# Patient Record
Sex: Female | Born: 1985 | Race: Black or African American | Hispanic: No | Marital: Single | Smoking: Former smoker
Health system: Southern US, Community
[De-identification: ages and names within clinical notes are randomized; demographics above are authoritative.]

## PROBLEM LIST (undated history)

## (undated) DIAGNOSIS — D649 Anemia, unspecified: Secondary | ICD-10-CM

## (undated) DIAGNOSIS — I10 Essential (primary) hypertension: Secondary | ICD-10-CM

## (undated) DIAGNOSIS — R519 Headache, unspecified: Secondary | ICD-10-CM

## (undated) DIAGNOSIS — O139 Gestational [pregnancy-induced] hypertension without significant proteinuria, unspecified trimester: Secondary | ICD-10-CM

## (undated) HISTORY — DX: Gestational (pregnancy-induced) hypertension without significant proteinuria, unspecified trimester: O13.9

## (undated) HISTORY — PX: NO PAST SURGERIES: SHX2092

## (undated) HISTORY — DX: Headache, unspecified: R51.9

## (undated) HISTORY — DX: Anemia, unspecified: D64.9

---

## 2010-06-08 ENCOUNTER — Emergency Department: Payer: Self-pay | Admitting: Emergency Medicine

## 2010-12-16 ENCOUNTER — Emergency Department: Payer: Self-pay | Admitting: Emergency Medicine

## 2012-05-14 ENCOUNTER — Emergency Department: Payer: Self-pay | Admitting: Emergency Medicine

## 2012-05-14 LAB — URINALYSIS, COMPLETE
Bilirubin,UR: NEGATIVE
Glucose,UR: NEGATIVE mg/dL (ref 0–75)
Ketone: NEGATIVE
Specific Gravity: 1.01 (ref 1.003–1.030)
Squamous Epithelial: 11
WBC UR: 8 /HPF (ref 0–5)

## 2012-05-14 LAB — CBC
HCT: 33.8 % — ABNORMAL LOW (ref 35.0–47.0)
HGB: 11.6 g/dL — ABNORMAL LOW (ref 12.0–16.0)
MCHC: 34.2 g/dL (ref 32.0–36.0)
RBC: 3.61 10*6/uL — ABNORMAL LOW (ref 3.80–5.20)
WBC: 21.5 10*3/uL — ABNORMAL HIGH (ref 3.6–11.0)

## 2012-05-14 LAB — COMPREHENSIVE METABOLIC PANEL
Albumin: 3.9 g/dL (ref 3.4–5.0)
Anion Gap: 10 (ref 7–16)
Bilirubin,Total: 0.8 mg/dL (ref 0.2–1.0)
Chloride: 104 mmol/L (ref 98–107)
Co2: 24 mmol/L (ref 21–32)
EGFR (African American): >60
EGFR (Non-African Amer.): >60
Potassium: 3.3 mmol/L — ABNORMAL LOW (ref 3.5–5.1)
SGOT(AST): 19 U/L (ref 15–37)
SGPT (ALT): 9 U/L — ABNORMAL LOW (ref 12–78)

## 2012-05-14 LAB — WET PREP, GENITAL

## 2012-05-14 LAB — LIPASE, BLOOD: Lipase: 78 U/L (ref 73–393)

## 2012-05-14 LAB — PREGNANCY, URINE: Pregnancy Test, Urine: NEGATIVE m[IU]/mL

## 2014-08-08 NOTE — L&D Delivery Note (Signed)
VAGINAL DELIVERY NOTE:  Date of Delivery: 04/30/2015 Primary OB: [facility] Gestational Age/EDD: Unknown Not found. Antepartum complications: none Attending Physician: Milon Score, CNM  Delivery Type: spontaneous vaginal delivery  Anesthesia: none Laceration: n/a Episiotomy: none Placenta: spontaneous Intrapartum complications: pre-cip birth Estimated Blood Loss: 100 ml  GBS: Positive Procedure Details: Called to check pt and she was complete and AROM with mod mec noted (brown) and while getting gloves on, pt preciped the vtx, and then the whole body at one push. Baby crying. I picked up the baby and dried and stimulated the crying baby. No sx used.  Baby: LivebornGirl, Wt7# 1oz 3200 gms, Apgars 9-9. No Lacs, SDOP intact with mec stained placenta. FF and lochia mod. VSS. Hemostasis achieved. Perineum intact.  Milon Score, CNM

## 2014-09-18 ENCOUNTER — Emergency Department: Payer: Self-pay | Admitting: Emergency Medicine

## 2014-09-24 ENCOUNTER — Emergency Department: Payer: Self-pay | Admitting: Student

## 2014-10-01 LAB — OB RESULTS CONSOLE HIV ANTIBODY (ROUTINE TESTING): HIV: NONREACTIVE

## 2014-10-02 ENCOUNTER — Ambulatory Visit: Payer: Self-pay | Admitting: Advanced Practice Midwife

## 2014-10-02 LAB — OB RESULTS CONSOLE ABO/RH: RH Type: POSITIVE

## 2014-10-02 LAB — OB RESULTS CONSOLE RPR: RPR: NONREACTIVE

## 2014-10-02 LAB — OB RESULTS CONSOLE ANTIBODY SCREEN: ANTIBODY SCREEN: NEGATIVE

## 2014-10-02 LAB — OB RESULTS CONSOLE HEPATITIS B SURFACE ANTIGEN: Hepatitis B Surface Ag: NEGATIVE

## 2014-10-10 ENCOUNTER — Emergency Department: Payer: Self-pay | Admitting: Emergency Medicine

## 2014-11-03 ENCOUNTER — Encounter
Admit: 2014-11-03 | Disposition: A | Discharge status: Home / Self Care | Payer: Self-pay | Admitting: Maternal & Fetal Medicine

## 2014-11-21 ENCOUNTER — Other Ambulatory Visit: Payer: Self-pay | Admitting: Maternal & Fetal Medicine

## 2014-11-21 DIAGNOSIS — IMO0002 Reserved for concepts with insufficient information to code with codable children: Secondary | ICD-10-CM

## 2014-11-21 DIAGNOSIS — Z0489 Encounter for examination and observation for other specified reasons: Secondary | ICD-10-CM

## 2014-12-08 ENCOUNTER — Ambulatory Visit
Admission: RE | Admit: 2014-12-08 | Discharge: 2014-12-08 | Disposition: A | Discharge status: Home / Self Care | Payer: Medicaid Other | Source: Ambulatory Visit | Attending: Maternal & Fetal Medicine | Admitting: Maternal & Fetal Medicine

## 2014-12-08 DIAGNOSIS — IMO0002 Reserved for concepts with insufficient information to code with codable children: Secondary | ICD-10-CM

## 2014-12-08 DIAGNOSIS — Z36 Encounter for antenatal screening of mother: Secondary | ICD-10-CM | POA: Diagnosis not present

## 2014-12-08 DIAGNOSIS — Z0489 Encounter for examination and observation for other specified reasons: Secondary | ICD-10-CM

## 2014-12-08 LAB — US OB DETAIL + 14 WK

## 2014-12-27 ENCOUNTER — Encounter: Payer: Self-pay | Admitting: Emergency Medicine

## 2014-12-27 DIAGNOSIS — K088 Other specified disorders of teeth and supporting structures: Secondary | ICD-10-CM | POA: Diagnosis present

## 2014-12-27 DIAGNOSIS — Z79899 Other long term (current) drug therapy: Secondary | ICD-10-CM | POA: Insufficient documentation

## 2014-12-27 DIAGNOSIS — M273 Alveolitis of jaws: Secondary | ICD-10-CM | POA: Diagnosis not present

## 2014-12-27 DIAGNOSIS — R51 Headache: Secondary | ICD-10-CM | POA: Diagnosis not present

## 2014-12-27 NOTE — ED Notes (Signed)
Pt says she had a tooth pulled on the bottom right side of her mouth on Tuesday; pain since; was prescribed Tylenol #3 (she is [redacted] weeks pregnant) and that is not helping her; was not prescribed antibiotic

## 2014-12-28 ENCOUNTER — Emergency Department
Admission: EM | Admit: 2014-12-28 | Discharge: 2014-12-28 | Disposition: A | Discharge status: Home / Self Care | Payer: Medicaid Other | Attending: Emergency Medicine | Admitting: Emergency Medicine

## 2014-12-28 DIAGNOSIS — K0889 Other specified disorders of teeth and supporting structures: Secondary | ICD-10-CM

## 2014-12-28 DIAGNOSIS — M273 Alveolitis of jaws: Secondary | ICD-10-CM

## 2014-12-28 MED ORDER — ACETAMINOPHEN 325 MG PO TABS
ORAL_TABLET | ORAL | Status: AC
  Administered 2014-12-28: 650 mg via ORAL
  Filled 2014-12-28: qty 2

## 2014-12-28 MED ORDER — OXYCODONE-ACETAMINOPHEN 5-325 MG PO TABS: 1.0000 | ORAL_TABLET | Freq: Four times a day (QID) | ORAL | Status: DC | PRN

## 2014-12-28 MED ORDER — CEPHALEXIN 500 MG PO CAPS
500.0000 mg | ORAL_CAPSULE | Freq: Once | ORAL | Status: AC
  Administered 2014-12-28: 500 mg via ORAL

## 2014-12-28 MED ORDER — OXYCODONE HCL 5 MG PO TABS: 5.0000 mg | ORAL_TABLET | Freq: Once | ORAL | Status: DC

## 2014-12-28 MED ORDER — CEPHALEXIN 500 MG PO CAPS
ORAL_CAPSULE | ORAL | Status: AC
  Administered 2014-12-28: 500 mg via ORAL
  Filled 2014-12-28: qty 1

## 2014-12-28 MED ORDER — CEPHALEXIN 500 MG PO CAPS: 500.0000 mg | ORAL_CAPSULE | Freq: Four times a day (QID) | ORAL | Status: AC

## 2014-12-28 MED ORDER — ACETAMINOPHEN 325 MG PO TABS
650.0000 mg | ORAL_TABLET | Freq: Once | ORAL | Status: AC
  Administered 2014-12-28: 650 mg via ORAL

## 2014-12-28 NOTE — Discharge Instructions (Signed)
Dental Dry Socket After a tooth is pulled (extracted), blood fills up the hole (socket) where the tooth once was. This blood hardens (clots) and protects the bone and nerves underneath. Normally, gums completely grow over the top of the bones and nerves and close an open socket in about a week. After several months, the clot is replaced by bone that grows into the socket. However, when blood does not fill up the extraction socket, or the blood clot is lost for some reason, a dry socket may form. This condition leaves the bone and nerves exposed to air, food, liquid, or anything else that enters the mouth. The gums cannot grow over the extraction socket because there is nothing to grow over, and the dry socket remains open.  CAUSES   Blood not filling up the extraction socket properly.  Anything that can dislodge a forming blood clot. Forceful spitting or sucking through a straw can pull a blood clot completely out of the socket and cause a dry socket.  Having a difficult extraction. The forceful pushing against the wall of the socket when the tooth is extracted causes the walls of the tooth socket to become crushed. This prevents bleeding into the socket because the blood vessels have been crushed closed.  Alcoholic drinks may dry out the blood clot and cause a dry socket.  Smoking can disturb blood clot formation and cause a dry socket. Factors that put you at an increased risk for a dry socket include:   Having lower teeth extracted.  Being female.  Poor oral hygiene.  Taking birth control pills.  Having your wisdom teeth extracted. SYMPTOMS  Severe, constant, dull throbbing pain. The pain generally begins 2 to 3 days after the tooth extraction. The pain may last about a week after it begins.  Bad smelling breath and bad taste in your mouth.  Ear pain. HOME CARE INSTRUCTIONS  Follow your dentist's instructions.  Only take over-the-counter or prescription medicines for pain,  discomfort, or fever as directed by your caregiver. If pain medication does not relieve the pain, you may need to see your dentistwho can clean the socket and place a medicated dressing on the extraction to promote healing.  Take your antibiotics as told, if prescribed. Finish them even if you start to feel better.  Wait at least a day before rinsing with warm salt water to avoid possibly dissolving the new blood clot. When salt water rinsing, spit gently to avoid pressure on the clot.  Avoid carbonated beverages.  Avoid alcohol.  Avoid smoking for a few days after surgery. Patients who have recently had oral surgery should avoid anything that may irritate the extraction socket or anything that may cause the blood clot inside the extraction socket from being dislodged. Carefully follow your instructions for after surgery care.  SEEK IMMEDIATE DENTAL CARE IF:  Your medicine does not relieve pain.  You have uncontrolled bleeding, marked swelling, or severe pain.  You develop a fever above 102 F (38.9 C), not controlled by medication.  You have difficulty swallowing or cannot open your mouth.  You have other severe symptoms. Document Released: 01/29/2003 Document Revised: 10/17/2011 Document Reviewed: 10/27/2010 [facility] [employer] Patient Information 2015 [place], [place]. This information is not intended to replace advice given to you by your health care provider. Make sure you discuss any questions you have with your health care provider.  Dental Pain A tooth ache may be caused by cavities (tooth decay). Cavities expose the nerve of the tooth to air  and hot or cold temperatures. It may come from an infection or abscess (also called a boil or furuncle) around your tooth. It is also often caused by dental caries (tooth decay). This causes the pain you are having. DIAGNOSIS  Your caregiver can diagnose this problem by exam. TREATMENT   If caused by an infection, it may be treated with medications  which kill germs (antibiotics) and pain medications as prescribed by your caregiver. Take medications as directed.  Only take over-the-counter or prescription medicines for pain, discomfort, or fever as directed by your caregiver.  Whether the tooth ache today is caused by infection or dental disease, you should see your dentist as soon as possible for further care. SEEK MEDICAL CARE IF: The exam and treatment you received today has been provided on an emergency basis only. This is not a substitute for complete medical or dental care. If your problem worsens or new problems (symptoms) appear, and you are unable to meet with your dentist, call or return to this location. SEEK IMMEDIATE MEDICAL CARE IF:   You have a fever.  You develop redness and swelling of your face, jaw, or neck.  You are unable to open your mouth.  You have severe pain uncontrolled by pain medicine. MAKE SURE YOU:   Understand these instructions.  Will watch your condition.  Will get help right away if you are not doing well or get worse. Document Released: 07/25/2005 Document Revised: 10/17/2011 Document Reviewed: 03/12/2008 [facility] Patient Information 2015 [place], [place]. This information is not intended to replace advice given to you by your health care provider. Make sure you discuss any questions you have with your health care provider.

## 2014-12-28 NOTE — ED Provider Notes (Signed)
[facility] Emergency Department Provider Note  ____________________________________________  Time seen: Approximately 5:00 AM  I have reviewed the triage vital signs and the nursing notes.   HISTORY  Chief Complaint Dental Pain    HPI Carly Stewart is a 29 y.o. female who comes in with mouth pain after having a tooth pulled proximally 5 days ago. The patient reports that she was given Tylenol 3 but it has not been helping with her pain. The patient reports that she did not receive antibiotics and has an appointment in 2 days to reevaluate her gums and teeth. The patient reports that the [facility] [place]. The patient denies any fevers but reports that she does have some pain in her right ear. She reports that the pain in her tooth is a 10 out of 10 in intensity and throbbing. The patient has not taken any ibuprofen as she is [redacted] weeks pregnant and cannot take those types of medications. The patient reports that she's had some headache as well as some vomiting due to this pain. Patient reports that she comes into the hospital as she has been unable to sleep due to the pain as well.   History reviewed. No pertinent past medical history.  There are no active problems to display for this patient.   History reviewed. No pertinent past surgical history.  Current Outpatient Rx  Name  Route  Sig  Dispense  Refill  . Prenatal Vit-Fe Fumarate-FA (MULTIVITAMIN-PRENATAL) 27-0.8 MG TABS tablet   Oral   Take 1 tablet by mouth daily at 12 noon.         . ondansetron (ZOFRAN) 4 MG tablet   Oral   Take 4 mg by mouth every 8 (eight) hours as needed for nausea or vomiting.           Allergies Review of patient's allergies indicates no known allergies.  History reviewed. No pertinent family history.  Social History History  Substance Use Topics  . Smoking status: Never Smoker   . Smokeless tobacco: Never Used  .  Alcohol Use: No    Review of Systems Constitutional: No fever/chills Eyes: No visual changes. ENT: No sore throat. Cardiovascular: Denies chest pain. Respiratory: Denies shortness of breath. Gastrointestinal: vomiting.   Genitourinary: Negative for dysuria. Musculoskeletal: Negative for back pain. Skin: Negative for rash. Neurological:  headaches,   10-point ROS otherwise negative.  ____________________________________________   PHYSICAL EXAM:  VITAL SIGNS: ED Triage Vitals  Enc Vitals Group     BP 12/27/14 2336 125/62 mmHg     Pulse Rate 12/27/14 2336 70     Resp 12/27/14 2336 18     Temp 12/27/14 2336 97.5 F (36.4 C)     Temp Source 12/27/14 2336 Oral     SpO2 12/27/14 2336 100 %     Weight 12/27/14 2336 118 lb (53.524 kg)     Height 12/27/14 2336 5\' 2"  (1.575 m)     Head Cir --      Peak Flow --      Pain Score 12/27/14 2337 10     Pain Loc --      Pain Edu? --      Excl. in GC? --     Constitutional: Alert and oriented. Well appearing and in moderate distress. Eyes: Conjunctivae are normal. PERRL. EOMI. Head: Atraumatic. Nose: No congestion/rhinnorhea. Mouth/Throat: Poor dentition right gums with a healing area no visible clot noted, oropharynx nonerythematous  Cardiovascular: Normal rate, regular rhythm. Grossly normal heart sounds.  Good peripheral circulation. Respiratory: Normal respiratory effort.  No retractions. Lungs CTAB. Gastrointestinal: Soft and nontender. Gravid uterus Genitourinary: Deferred Musculoskeletal: No lower extremity tenderness nor edema.  Neurologic:  Normal speech and language. No gross focal neurologic deficits are appreciated.  Skin:  Skin is warm, dry and intact. No rash noted. Psychiatric: Mood and affect are normal. Speech and behavior are normal.  ____________________________________________   LABS (all labs ordered are listed, but only abnormal results are displayed)  Labs Reviewed - No data to  display ____________________________________________  EKG  None ____________________________________________  RADIOLOGY  None ____________________________________________   PROCEDURES  Procedure(s) performed: None  Critical Care performed: No  ____________________________________________   INITIAL IMPRESSION / ASSESSMENT AND PLAN / ED COURSE  Pertinent labs & imaging results that were available during my care of the patient were reviewed by me and considered in my medical decision making (see chart for details).  This is a 29 year old female who is [redacted] weeks pregnant comes in with right pain in her jaw after having a tooth pulled last week. Since the patient is pregnant I am unable to give her NSAIDs which may help with the inflammation. I will give the patient a dose of Keflex 500 mg oral 1. I will also hold off on giving the patient any stronger pain medication as again she is pregnant and it may be harmful to the fetus.  After evaluating the pharmaceutical database oxycodone is found to be a class B. I will give the patient a dose of oxycodone and discharge her to follow-up with her dentist. ____________________________________________   FINAL CLINICAL IMPRESSION(S) / ED DIAGNOSES  Final diagnoses:  Dry socket  Dental pain       Rebecka Apley, MD 12/28/14 [phone]

## 2015-02-11 LAB — HM HIV SCREENING LAB: HM HIV Screening: NEGATIVE

## 2015-04-24 LAB — OB RESULTS CONSOLE GC/CHLAMYDIA
Chlamydia: NEGATIVE
Gonorrhea: NEGATIVE

## 2015-04-24 LAB — OB RESULTS CONSOLE GBS: STREP GROUP B AG: POSITIVE

## 2015-04-30 ENCOUNTER — Inpatient Hospital Stay
Admission: EM | Admit: 2015-04-30 | Discharge: 2015-05-03 | DRG: 774 | Disposition: A | Discharge status: Home / Self Care | Payer: Medicaid Other | Attending: Obstetrics and Gynecology | Admitting: Obstetrics and Gynecology

## 2015-04-30 DIAGNOSIS — F17210 Nicotine dependence, cigarettes, uncomplicated: Secondary | ICD-10-CM | POA: Diagnosis present

## 2015-04-30 DIAGNOSIS — O9089 Other complications of the puerperium, not elsewhere classified: Secondary | ICD-10-CM | POA: Diagnosis not present

## 2015-04-30 DIAGNOSIS — E876 Hypokalemia: Secondary | ICD-10-CM | POA: Diagnosis not present

## 2015-04-30 DIAGNOSIS — Z3A38 38 weeks gestation of pregnancy: Secondary | ICD-10-CM | POA: Diagnosis present

## 2015-04-30 DIAGNOSIS — O1092 Unspecified pre-existing hypertension complicating childbirth: Secondary | ICD-10-CM | POA: Diagnosis present

## 2015-04-30 DIAGNOSIS — O99334 Smoking (tobacco) complicating childbirth: Secondary | ICD-10-CM | POA: Diagnosis present

## 2015-04-30 DIAGNOSIS — O99824 Streptococcus B carrier state complicating childbirth: Principal | ICD-10-CM | POA: Diagnosis present

## 2015-04-30 DIAGNOSIS — O1002 Pre-existing essential hypertension complicating childbirth: Secondary | ICD-10-CM | POA: Diagnosis present

## 2015-04-30 DIAGNOSIS — O99820 Streptococcus B carrier state complicating pregnancy: Secondary | ICD-10-CM | POA: Diagnosis present

## 2015-04-30 LAB — CBC
HEMATOCRIT: 34.2 % — AB (ref 35.0–47.0)
Hemoglobin: 11.4 g/dL — ABNORMAL LOW (ref 12.0–16.0)
MCH: 31.3 pg (ref 26.0–34.0)
MCHC: 33.3 g/dL (ref 32.0–36.0)
MCV: 94 fL (ref 80.0–100.0)
Platelets: 200 10*3/uL (ref 150–440)
RBC: 3.64 MIL/uL — ABNORMAL LOW (ref 3.80–5.20)
RDW: 14.4 % (ref 11.5–14.5)
WBC: 7.3 10*3/uL (ref 3.6–11.0)

## 2015-04-30 LAB — COMPREHENSIVE METABOLIC PANEL
ALT: 9 U/L — AB (ref 14–54)
AST: 27 U/L (ref 15–41)
Albumin: 3.2 g/dL — ABNORMAL LOW (ref 3.5–5.0)
Alkaline Phosphatase: 212 U/L — ABNORMAL HIGH (ref 38–126)
Anion gap: 6 (ref 5–15)
BUN: <5 mg/dL — ABNORMAL LOW (ref 6–20)
CHLORIDE: 105 mmol/L (ref 101–111)
CO2: 22 mmol/L (ref 22–32)
CREATININE: 0.42 mg/dL — AB (ref 0.44–1.00)
Calcium: 8.7 mg/dL — ABNORMAL LOW (ref 8.9–10.3)
Glucose, Bld: 89 mg/dL (ref 65–99)
Potassium: 3 mmol/L — ABNORMAL LOW (ref 3.5–5.1)
SODIUM: 133 mmol/L — AB (ref 135–145)
Total Bilirubin: 0.5 mg/dL (ref 0.3–1.2)
Total Protein: 6.9 g/dL (ref 6.5–8.1)

## 2015-04-30 LAB — URINE DRUG SCREEN, QUALITATIVE (ARMC ONLY)
AMPHETAMINES, UR SCREEN: NOT DETECTED
Barbiturates, Ur Screen: NOT DETECTED
Benzodiazepine, Ur Scrn: NOT DETECTED
COCAINE METABOLITE, UR ~~LOC~~: NOT DETECTED
Cannabinoid 50 Ng, Ur ~~LOC~~: POSITIVE — AB
MDMA (ECSTASY) UR SCREEN: NOT DETECTED
Methadone Scn, Ur: NOT DETECTED
OPIATE, UR SCREEN: NOT DETECTED
PHENCYCLIDINE (PCP) UR S: NOT DETECTED
Tricyclic, Ur Screen: NOT DETECTED

## 2015-04-30 LAB — TYPE AND SCREEN
ABO/RH(D): AB POS
Antibody Screen: NEGATIVE

## 2015-04-30 LAB — PROTEIN / CREATININE RATIO, URINE
Creatinine, Urine: 108 mg/dL
PROTEIN CREATININE RATIO: 0.17 mg/mg{creat} — AB (ref 0.00–0.15)
TOTAL PROTEIN, URINE: 18 mg/dL

## 2015-04-30 LAB — CHLAMYDIA/NGC RT PCR (ARMC ONLY)
Chlamydia Tr: NOT DETECTED
N gonorrhoeae: NOT DETECTED

## 2015-04-30 LAB — ABO/RH: ABO/RH(D): AB POS

## 2015-04-30 LAB — URIC ACID: URIC ACID, SERUM: 5.5 mg/dL (ref 2.3–6.6)

## 2015-04-30 MED ORDER — AMMONIA AROMATIC IN INHA
RESPIRATORY_TRACT | Status: AC
  Filled 2015-04-30: qty 10

## 2015-04-30 MED ORDER — CITRIC ACID-SODIUM CITRATE 334-500 MG/5ML PO SOLN: 30.0000 mL | ORAL | Status: DC | PRN

## 2015-04-30 MED ORDER — LIDOCAINE HCL (PF) 1 % IJ SOLN
30.0000 mL | INTRAMUSCULAR | Status: DC | PRN
  Filled 2015-04-30: qty 30

## 2015-04-30 MED ORDER — PRENATAL MULTIVITAMIN CH
1.0000 | ORAL_TABLET | Freq: Every day | ORAL | Status: DC
  Administered 2015-05-02 – 2015-05-03 (×2): 1 via ORAL
  Filled 2015-04-30 (×3): qty 1

## 2015-04-30 MED ORDER — SODIUM CHLORIDE 0.9 % IJ SOLN: 3.0000 mL | Freq: Two times a day (BID) | INTRAMUSCULAR | Status: DC

## 2015-04-30 MED ORDER — DIPHENHYDRAMINE HCL 25 MG PO CAPS
25.0000 mg | ORAL_CAPSULE | Freq: Four times a day (QID) | ORAL | Status: DC | PRN
Start: 2015-04-30 — End: 2015-05-03

## 2015-04-30 MED ORDER — SODIUM CHLORIDE 0.9 % IJ SOLN: 3.0000 mL | INTRAMUSCULAR | Status: DC | PRN

## 2015-04-30 MED ORDER — ONDANSETRON HCL 4 MG/2ML IJ SOLN: 4.0000 mg | INTRAMUSCULAR | Status: DC | PRN

## 2015-04-30 MED ORDER — ONDANSETRON HCL 4 MG/2ML IJ SOLN
4.0000 mg | Freq: Four times a day (QID) | INTRAMUSCULAR | Status: DC | PRN
Start: 2015-04-30 — End: 2015-05-01

## 2015-04-30 MED ORDER — SIMETHICONE 80 MG PO CHEW: 80.0000 mg | CHEWABLE_TABLET | ORAL | Status: DC | PRN

## 2015-04-30 MED ORDER — ACETAMINOPHEN 325 MG PO TABS: 650.0000 mg | ORAL_TABLET | ORAL | Status: DC | PRN

## 2015-04-30 MED ORDER — BUTORPHANOL TARTRATE 1 MG/ML IJ SOLN: 1.0000 mg | INTRAMUSCULAR | Status: DC | PRN

## 2015-04-30 MED ORDER — SODIUM CHLORIDE 0.9 % IV SOLN: 250.0000 mL | INTRAVENOUS | Status: DC | PRN

## 2015-04-30 MED ORDER — LACTATED RINGERS IV SOLN: 500.0000 mL | INTRAVENOUS | Status: DC | PRN

## 2015-04-30 MED ORDER — PENICILLIN G POTASSIUM 5000000 UNITS IJ SOLR
5.0000 10*6.[IU] | Freq: Once | INTRAVENOUS | Status: DC
  Filled 2015-04-30: qty 5

## 2015-04-30 MED ORDER — DIBUCAINE 1 % RE OINT: 1.0000 "application " | TOPICAL_OINTMENT | RECTAL | Status: DC | PRN

## 2015-04-30 MED ORDER — PENICILLIN G POTASSIUM 5000000 UNITS IJ SOLR
2.5000 10*6.[IU] | INTRAMUSCULAR | Status: DC
  Filled 2015-04-30 (×7): qty 2.5

## 2015-04-30 MED ORDER — OXYTOCIN BOLUS FROM INFUSION: 500.0000 mL | INTRAVENOUS | Status: DC

## 2015-04-30 MED ORDER — OXYTOCIN 40 UNITS IN LACTATED RINGERS INFUSION - SIMPLE MED
62.5000 mL/h | INTRAVENOUS | Status: DC
  Administered 2015-04-30: 40 [IU] via INTRAVENOUS

## 2015-04-30 MED ORDER — OXYTOCIN 10 UNIT/ML IJ SOLN
INTRAMUSCULAR | Status: AC
  Filled 2015-04-30: qty 2

## 2015-04-30 MED ORDER — LABETALOL HCL 5 MG/ML IV SOLN
10.0000 mg | INTRAVENOUS | Status: DC | PRN
  Administered 2015-04-30 (×2): 10 mg via INTRAVENOUS

## 2015-04-30 MED ORDER — IBUPROFEN 600 MG PO TABS
ORAL_TABLET | ORAL | Status: AC
  Administered 2015-04-30: 600 mg via ORAL
  Filled 2015-04-30: qty 1

## 2015-04-30 MED ORDER — LANOLIN HYDROUS EX OINT: TOPICAL_OINTMENT | CUTANEOUS | Status: DC | PRN

## 2015-04-30 MED ORDER — OXYTOCIN 40 UNITS IN LACTATED RINGERS INFUSION - SIMPLE MED: 62.5000 mL/h | INTRAVENOUS | Status: DC | PRN

## 2015-04-30 MED ORDER — LACTATED RINGERS IV SOLN
INTRAVENOUS | Status: DC
  Administered 2015-04-30: 19:00:00 via INTRAVENOUS

## 2015-04-30 MED ORDER — BISACODYL 10 MG RE SUPP: 10.0000 mg | Freq: Every day | RECTAL | Status: DC | PRN

## 2015-04-30 MED ORDER — WITCH HAZEL-GLYCERIN EX PADS: 1.0000 "application " | MEDICATED_PAD | CUTANEOUS | Status: DC | PRN

## 2015-04-30 MED ORDER — TETANUS-DIPHTH-ACELL PERTUSSIS 5-2.5-18.5 LF-MCG/0.5 IM SUSP
0.5000 mL | Freq: Once | INTRAMUSCULAR | Status: AC
  Administered 2015-05-03: 0.5 mL via INTRAMUSCULAR
  Filled 2015-04-30 (×2): qty 0.5

## 2015-04-30 MED ORDER — SENNOSIDES-DOCUSATE SODIUM 8.6-50 MG PO TABS
2.0000 | ORAL_TABLET | ORAL | Status: DC
  Administered 2015-05-01: 2 via ORAL
  Filled 2015-04-30: qty 2

## 2015-04-30 MED ORDER — ONDANSETRON HCL 4 MG PO TABS: 4.0000 mg | ORAL_TABLET | ORAL | Status: DC | PRN

## 2015-04-30 MED ORDER — LIDOCAINE HCL (PF) 1 % IJ SOLN
INTRAMUSCULAR | Status: AC
  Filled 2015-04-30: qty 30

## 2015-04-30 MED ORDER — MISOPROSTOL 200 MCG PO TABS
ORAL_TABLET | ORAL | Status: AC
  Filled 2015-04-30: qty 4

## 2015-04-30 MED ORDER — MEASLES, MUMPS & RUBELLA VAC ~~LOC~~ INJ
0.5000 mL | INJECTION | Freq: Once | SUBCUTANEOUS | Status: AC
Start: 2015-05-01 — End: 2015-05-03
  Administered 2015-05-03: 0.5 mL via SUBCUTANEOUS
  Filled 2015-04-30 (×2): qty 0.5

## 2015-04-30 MED ORDER — LABETALOL HCL 5 MG/ML IV SOLN
INTRAVENOUS | Status: AC
  Administered 2015-04-30: 10 mg via INTRAVENOUS
  Filled 2015-04-30: qty 4

## 2015-04-30 MED ORDER — OXYCODONE-ACETAMINOPHEN 5-325 MG PO TABS
2.0000 | ORAL_TABLET | ORAL | Status: DC | PRN
  Administered 2015-05-01: 2 via ORAL
  Filled 2015-04-30: qty 2

## 2015-04-30 MED ORDER — BENZOCAINE-MENTHOL 20-0.5 % EX AERO: 1.0000 "application " | INHALATION_SPRAY | CUTANEOUS | Status: DC | PRN

## 2015-04-30 MED ORDER — IBUPROFEN 600 MG PO TABS
600.0000 mg | ORAL_TABLET | Freq: Four times a day (QID) | ORAL | Status: DC
  Administered 2015-04-30 – 2015-05-03 (×8): 600 mg via ORAL
  Filled 2015-04-30 (×7): qty 1

## 2015-04-30 MED ORDER — OXYCODONE-ACETAMINOPHEN 5-325 MG PO TABS: 1.0000 | ORAL_TABLET | ORAL | Status: DC | PRN

## 2015-04-30 MED ORDER — ZOLPIDEM TARTRATE 5 MG PO TABS: 5.0000 mg | ORAL_TABLET | Freq: Every evening | ORAL | Status: DC | PRN

## 2015-04-30 MED ORDER — FLEET ENEMA 7-19 GM/118ML RE ENEM: 1.0000 | ENEMA | Freq: Every day | RECTAL | Status: DC | PRN

## 2015-04-30 MED ORDER — OXYTOCIN 40 UNITS IN LACTATED RINGERS INFUSION - SIMPLE MED
INTRAVENOUS | Status: AC
  Administered 2015-04-30: 40 [IU] via INTRAVENOUS
  Filled 2015-04-30: qty 1000

## 2015-04-30 NOTE — Progress Notes (Signed)
BP is elevated after delivery at 170's/90's. Will do a prot/creat ratio and get PIH labs.

## 2015-04-30 NOTE — H&P (Signed)
29 yo Black female in active labor with cx exam of 7 cms on admission.  Obstetrics Admission History & Physical  Referring Provider [facility] Primary OBGYN:  [facility]   Chief Complaint: Pt is here for admission due to active labor pattern.  History of Present Illness  29 y.o. G1P0 @ term with LMP of 07/11/2014 & EDD of 05/09/2015. Pregnancy complicated by: Trichomoniasis diagnosed 10/01/14,tx with Metronidazole, TOC on 12/17/14 neg, +drug screen for Marijuana, Rubella non-immune, GBS +, Tobacco abuse (2-3 cigs a day)  Ms. Carly Stewart presents for active labor pattern with advanced cx dilation this am.   Review of Systems: Positive for pain with labor S/S. No ROM, no VB, no decreased FM, +UC's.  Otherwise, her 12 point review of systems is negative or as noted in the History of Present Illness.  Patient Active Problem List   Diagnosis Date Noted  . Indication for care in labor and delivery, antepartum 04/30/2015    PMH: Trich with tx TOC 12/17/14 neg, Tobacco abuse, + Drug screen for Marijuana  PSHx: No past surgical history on file. Medications:  Prescriptions prior to admission  Medication Sig Dispense Refill Last Dose  . ondansetron (ZOFRAN) 4 MG tablet Take 4 mg by mouth every 8 (eight) hours as needed for nausea or vomiting.   Not Taking at Unknown time  . oxyCODONE-acetaminophen (ROXICET) 5-325 MG per tablet Take 1 tablet by mouth every 6 (six) hours as needed. 6 tablet 0   . Prenatal Vit-Fe Fumarate-FA (MULTIVITAMIN-PRENATAL) 27-0.8 MG TABS tablet Take 1 tablet by mouth daily at 12 noon.   12/27/2014 at Unknown time   Allergies: has No Known Allergies. OBHx:  [id number]  GYNHx:  History of abnormal pap smears: No. History of STIs: Yes.  Marland Kitchen             FHx: No family history on file. Soc Hx:  Social History   Social History  . Marital Status: Single    Spouse Name: N/A  . Number of Children: N/A  . Years of Education: N/A   Occupational History  . Not on file.   Social  History Main Topics  . Smoking status: Never Smoker   . Smokeless tobacco: Never Used  . Alcohol Use: No  . Drug Use: No  . Sexual Activity: Not on file   Other Topics Concern  . Not on file   Social History Narrative   Objective  There were no vitals filed for this visit.   No data recorded.  No intake or output data in the 24 hours ending 04/30/15 0910   Current Vital Signs 24h Vital Sign Ranges  T   No Data Recorded  BP   No Data Recorded  HR   No Data Recorded  RR   No Data Recorded  SaO2     No Data Recorded       24 Hour I/O Current Shift I/O  Time Ins Outs       EFM: 135, Cat 2, +accels, 2 decels (variables vs early), mod variabilty Toco: q , lasts 30-55 secs  General: Well nourished, well developed female in no acute distress.  Skin:  Warm and dry.  Cardiovascular: S1S2, RRR, No M/R/G. Respiratory:  Clear to auscultation bilateral. Normal respiratory effort. No W/R/R. Abdomen: Gravid, EFW# Neuro/Psych:  Normal mood and affect.    SVE:7/ [place]/EFW:   Ultrasounds None available on chart found in the 3 page records sent from [facility]  Perinatal info  AB +,  Antibody neg, RPR NR, HBSAG: Neg, GC/CH neg, +drug screen MJ, Sickle cell neg, Varicella immune, Rubella non-immune, + Trich in the past with TOC neg, GBS +,   Assessment & Plan   29 y.o. G1P0 @ term with active labor pattern and cx of 7 cms being admitted for delivery.   A: IUP at 38 6/7 weeks 2. GBS pos P: Admit for delivery. 2. Antibiotics ordered for GBS coverage. 3. Continue to monitor VS. 4. External fetal and uterine monitors. 5. Antic SVD. 6. Rubella non-immune and will give pp.  Sharee Pimple, MSN, CNM, FNP [facility]

## 2015-04-30 NOTE — Progress Notes (Addendum)
VAGINAL DELIVERY NOTE:  Date of Delivery: 04/30/2015 Primary OB: [facility] Gestational Age/EDD: Unknown Not found. Antepartum complications: none Attending Physician: Milon Score, CNM   Delivery Type: spontaneous vaginal delivery  Anesthesia: none Laceration: n/a Episiotomy: none Placenta: spontaneous Intrapartum complications: pre-cip birth Estimated Blood Loss: 100 ml  GBS: Positive Procedure Details: Called to check pt and she was complete and AROM with mod mec noted (brown) and while getting gloves on, pt preciped the vtx, and then the whole body at one push. Baby crying. I picked up the baby and dried and stimulated the crying baby. No sx used.  Baby: LivebornGirl, Wt7# 1oz 3200 gms, Apgars 9-9. No Lacs, SDOP intact with mec stained placenta. FF and lochia mod. VSS. Hemostasis achieved. Perineum intact.

## 2015-05-01 LAB — COMPREHENSIVE METABOLIC PANEL
ALK PHOS: 167 U/L — AB (ref 38–126)
ALT: 9 U/L — AB (ref 14–54)
ANION GAP: 6 (ref 5–15)
AST: 25 U/L (ref 15–41)
Albumin: 3.1 g/dL — ABNORMAL LOW (ref 3.5–5.0)
BILIRUBIN TOTAL: 0.7 mg/dL (ref 0.3–1.2)
BUN: 6 mg/dL (ref 6–20)
CALCIUM: 9.3 mg/dL (ref 8.9–10.3)
CO2: 28 mmol/L (ref 22–32)
CREATININE: 0.5 mg/dL (ref 0.44–1.00)
Chloride: 105 mmol/L (ref 101–111)
Glucose, Bld: 96 mg/dL (ref 65–99)
Potassium: 3.1 mmol/L — ABNORMAL LOW (ref 3.5–5.1)
Sodium: 139 mmol/L (ref 135–145)
TOTAL PROTEIN: 6.4 g/dL — AB (ref 6.5–8.1)

## 2015-05-01 LAB — CBC
HCT: 32.2 % — ABNORMAL LOW (ref 35.0–47.0)
HCT: 34.2 % — ABNORMAL LOW (ref 35.0–47.0)
HEMOGLOBIN: 10.5 g/dL — AB (ref 12.0–16.0)
HEMOGLOBIN: 11.4 g/dL — AB (ref 12.0–16.0)
MCH: 30.6 pg (ref 26.0–34.0)
MCH: 31.5 pg (ref 26.0–34.0)
MCHC: 32.7 g/dL (ref 32.0–36.0)
MCHC: 33.3 g/dL (ref 32.0–36.0)
MCV: 93.8 fL (ref 80.0–100.0)
MCV: 94.5 fL (ref 80.0–100.0)
PLATELETS: 175 10*3/uL (ref 150–440)
PLATELETS: 199 10*3/uL (ref 150–440)
RBC: 3.43 MIL/uL — AB (ref 3.80–5.20)
RBC: 3.62 MIL/uL — AB (ref 3.80–5.20)
RDW: 14 % (ref 11.5–14.5)
RDW: 14.3 % (ref 11.5–14.5)
WBC: 9.8 10*3/uL (ref 3.6–11.0)
WBC: 8.8 10*3/uL (ref 3.6–11.0)

## 2015-05-01 LAB — RPR: RPR Ser Ql: NONREACTIVE

## 2015-05-01 MED ORDER — LABETALOL HCL 5 MG/ML IV SOLN: 20.0000 mg | INTRAVENOUS | Status: DC | PRN

## 2015-05-01 MED ORDER — MAGNESIUM SULFATE BOLUS VIA INFUSION
4.0000 g | Freq: Once | INTRAVENOUS | Status: DC
  Filled 2015-05-01: qty 500

## 2015-05-01 MED ORDER — MAGNESIUM SULFATE 4 GM/100ML IV SOLN
INTRAVENOUS | Status: AC
  Administered 2015-05-01: 4 g
  Filled 2015-05-01: qty 100

## 2015-05-01 MED ORDER — HYDRALAZINE HCL 20 MG/ML IJ SOLN: 10.0000 mg | Freq: Once | INTRAMUSCULAR | Status: DC | PRN

## 2015-05-01 MED ORDER — MAGNESIUM SULFATE 50 % IJ SOLN
2.0000 g/h | INTRAVENOUS | Status: DC
  Administered 2015-05-01: 2 g/h via INTRAVENOUS
  Filled 2015-05-01: qty 80

## 2015-05-01 MED ORDER — MAGNESIUM SULFATE 50 % IJ SOLN: 2.0000 g/h | INTRAVENOUS | Status: DC

## 2015-05-01 NOTE — Progress Notes (Addendum)
Interval note:  S: HA has improved / denies visual disturbances, epigastric pain  O: BP's improving  Blood pressure 143/76, pulse 71, temperature 98.6 F (37 C), temperature source Oral, resp. rate 18, height '5\' 3"'  (1.6 m), weight 71.215 kg (157 lb), last menstrual period 07/11/2014, SpO2 100 %, unknown if currently breastfeeding.  General: Alert, oriented x 3, NAD Lungs: clear, equal bilaterally Heart: RRR Extremities: no edema / +2 DTRs / no clonus  Pre-eclampsia labs stable so far Protein/Creatinine Ratio: pending   Sodium 135 - 145 mmol/L 139   Potassium 3.5 - 5.1 mmol/L 3.1 (L)   Chloride 101 - 111 mmol/L 105   CO2 22 - 32 mmol/L 28   Glucose, Bld 65 - 99 mg/dL 96   BUN 6 - 20 mg/dL 6   Creatinine, Ser 0.44 - 1.00 mg/dL 0.50   Calcium 8.9 - 10.3 mg/dL 9.3   Total Protein 6.5 - 8.1 g/dL 6.4 (L)   Albumin 3.5 - 5.0 g/dL 3.1 (L)   AST 15 - 41 U/L 25   ALT 14 - 54 U/L 9 (L)   Alkaline Phosphatase 38 - 126 U/L 167 (H)   Total Bilirubin 0.3 - 1.2 mg/dL 0.7   GFR calc non Af Amer >60 mL/min >60   GFR calc Af Amer >60 mL/min >60   Comments: (NOTE)  The eGFR has been calculated using the CKD EPI equation.  This calculation has not been validated in all clinical situations.  eGFR's persistently <60 mL/min signify possible Chronic Kidney  Disease.     Anion gap 5 - 15  6        Platelets: 199  Assessment:  PPD#1 SVD R/O pre-eclampsia with severe features  Hypokalemia   Plan: Rest well tonight Continue Magnesium Continue to monitor  Lars Pinks, CNM

## 2015-05-01 NOTE — Progress Notes (Signed)
PPD #1, SVD, Baby girl  S:  Reports feeling good, with no complaints              Tolerating po/ No nausea or vomiting             Bleeding is light             Pain controlled with Motrin             Up ad lib / ambulatory / voiding QS  Newborn bottle feeding - going well   O:               VS: BP 143/84 mmHg  Pulse 59  Temp(Src) 97.9 F (36.6 C) (Oral)  Resp 20  Ht  (1.6 m)  Wt 71.215 kg (157 lb)  BMI 27.82 kg/m2  SpO2 99%  LMP 07/11/2014  Breastfeeding? Unknown   LABS:              Recent Labs  04/30/15 0933 05/01/15 0631  WBC 7.3 9.8  HGB 11.4* 10.5*  PLT 200 175               Blood type: --/--/AB POS (09/22 0934)  Rubella:   Non-immune                    I&O: Intake/Output      09/22 0701 - 09/23 0700 09/23 0701 - 09/24 0700   P.O. 840    I.V. (mL/kg) 1250 (17.6)    Total Intake(mL/kg) 2090 (29.3)    Urine (mL/kg/hr) 1850    Total Output 1850     Net +240                        Physical Exam:             Alert and oriented X3  Lungs: Clear and unlabored  Heart: regular rate and rhythm / no mumurs  Abdomen: soft, non-tender, non-distended              Fundus: firm, non-tender, U-1   Perineum: intact, no erythema, no significant edema, no ecchymosis   Lochia: light, no clots  Extremities: no edema, no calf pain or tenderness    A: PPD # 1   Doing well - stable status  GBS positive without antibiotics   + Urine Drug Screen for - cannabis   P: Routine post partum orders  Peds to follow newborn - plan to keep newborn for 48 hours   CSW consult  Anticipate discharge home tomorrow     Karena Addison, CNM

## 2015-05-01 NOTE — Progress Notes (Signed)
  Subjective:  HPI: Pt.'s nurse paged d/t patient c/o of headache with blood pressure of: 158/97 / pt. Instructed by nurse to rest and lie down and when she rechecked the BP it was 179/100  Review of systems:        General: alert, oriented x 3       Neurological: +HA, no visual disturbances       Cardiac: no chest pain, no heart palpitations        Pulmonary: no SOB, no wheezing       Gastrointestinal: no N/V/C/D / no epigastric pain        Gynecological: PPD #1 SVD       Urinary: no dysuria, no urgency, no frequency   Objective:  Vital signs: VS:  Filed Vitals:   05/01/15 2009 05/01/15 2014 05/01/15 2114 05/01/15 2214  BP: 177/94 149/98 130/88 143/76  Pulse: 64 62 61 71  Temp:      TempSrc:      Resp:      Height:      Weight:      SpO2:        Physical exam:        General appearance/behavior: alert, oriented x 3, clam       Heart: S1, S2, RRR       Lungs: clear, equal bilaterally       Abdomen: soft, U-E, non-tender/ +NS       Extremities: no edema, +2 DTRs, no clonus   Assessment: PPD #1 SVD Pre-eclampsia with severe features  Plan:  Discussed plan of care with Dr. Dalbert Garnet Pre-eclampsia protocol in place Insert PIV Transfer to Labor and Delivery for Mag 4 gram bolus then 2 grams/hour x 24 hours Monitor BP's/ I&Os  Will continue to monitor  Dr. Dalbert Garnet aware and agrees with plan of care  Karena Addison, CNM

## 2015-05-02 LAB — PROTEIN / CREATININE RATIO, URINE
Creatinine, Urine: 26 mg/dL
Total Protein, Urine: <6 mg/dL

## 2015-05-02 MED ORDER — POTASSIUM CHLORIDE ER 10 MEQ PO TBCR
10.0000 meq | EXTENDED_RELEASE_TABLET | Freq: Two times a day (BID) | ORAL | Status: DC
  Administered 2015-05-02 – 2015-05-03 (×2): 10 meq via ORAL
  Filled 2015-05-02 (×6): qty 1

## 2015-05-02 MED ORDER — SODIUM CHLORIDE 0.9 % IJ SOLN
3.0000 mL | Freq: Four times a day (QID) | INTRAMUSCULAR | Status: DC
  Administered 2015-05-02 – 2015-05-03 (×2): 3 mL via INTRAVENOUS

## 2015-05-02 MED ORDER — SODIUM CHLORIDE 0.9 % IJ SOLN
INTRAMUSCULAR | Status: AC
  Administered 2015-05-02: 1 mL
  Filled 2015-05-02: qty 3

## 2015-05-02 NOTE — Plan of Care (Signed)
Mag. Sulfate d/c. Will transfer pt back to mb floor. No c/o headache at present time. Order received for saline lock for iv.

## 2015-05-02 NOTE — Progress Notes (Signed)
PPD # 2, SVD, baby girl   S:  Reports feeling good and would really like to go home today because "she feels fine, she is not having a headache anymore and blood pressure has come down"             Tolerating po/ No nausea or vomiting             Bleeding is light             Pain controlled with Motrin             Up ad lib / ambulatory / voiding QS  Newborn formula feeding    Denies HA, visual disturbances, epigastric pain  O:               VS: BP 133/82 mmHg  Pulse 59  Temp(Src) 98.2 F (36.8 C) (Oral)  Resp 16  Ht '5\' 3"'  (1.6 m)  Wt 71.215 kg (157 lb)  BMI 27.82 kg/m2  SpO2 100%  LMP 07/11/2014  Breastfeeding? Unknown   LABS:              Recent Labs  05/01/15 0631 05/01/15 2124  WBC 9.8 8.8  HGB 10.5* 11.4*  PLT 175 199  Pre-eclampsia labs reviewed and stable Protein-creatinine ratio - pending              Blood type: --/--/AB POS (09/22 0934)  Rubella:        Non-immune               I&O: Intake/Output      09/23 0701 - 09/24 0700 09/24 0701 - 09/25 0700   P.O. 240 150   I.V. (mL/kg) 125 (1.8) 125 (1.8)   Total Intake(mL/kg) 365 (5.1) 275 (3.9)   Urine (mL/kg/hr) 2600 (1.5) 650 (5.1)   Total Output 2600 650   Net -2235 -375                      Physical Exam:             Alert and oriented X3  Lungs: Clear and unlabored  Heart: regular rate and rhythm / no mumurs  Abdomen: soft, non-tender, non-distended              Fundus: firm, non-tender, U-2   Perineum: intact, no erythema, no edema, no ecchymosis  Lochia: scant, no clots   Extremities: no edema, no calf pain or tenderness, +2DTRs, no clonus    A: PPD # 2  Doing well - stable status  R/O Postpartum Pre-eclampsia  P: Routine post partum orders  Continue postpartum Magnesium for 24 hours - to end at [place] MMR upon discharge  Anticipate discharge home tomorrow morning  Darliss Cheney, CNM

## 2015-05-03 DIAGNOSIS — O1002 Pre-existing essential hypertension complicating childbirth: Secondary | ICD-10-CM | POA: Diagnosis present

## 2015-05-03 MED ORDER — MEDROXYPROGESTERONE ACETATE 150 MG/ML IM SUSP: 150.0000 mg | INTRAMUSCULAR | Status: DC

## 2015-05-03 MED ORDER — NIFEDIPINE ER 30 MG PO TB24: 30.0000 mg | ORAL_TABLET | Freq: Every day | ORAL | Status: DC

## 2015-05-03 MED ORDER — NIFEDIPINE ER OSMOTIC RELEASE 30 MG PO TB24
30.0000 mg | ORAL_TABLET | Freq: Every day | ORAL | Status: DC
  Administered 2015-05-03: 30 mg via ORAL
  Filled 2015-05-03: qty 1

## 2015-05-03 MED ORDER — IBUPROFEN 600 MG PO TABS: 600.0000 mg | ORAL_TABLET | Freq: Four times a day (QID) | ORAL | Status: DC

## 2015-05-03 MED ORDER — MEDROXYPROGESTERONE ACETATE 150 MG/ML IM SUSP
150.0000 mg | Freq: Once | INTRAMUSCULAR | Status: AC
  Administered 2015-05-03: 150 mg via INTRAMUSCULAR
  Filled 2015-05-03: qty 1

## 2015-05-03 NOTE — Progress Notes (Signed)
PPD # 3, SVD, S/P Magnesium Sulfate for r/o PP Pre-eclampsia   S:  Reports feeling good and ready to go home today             Tolerating po/ No nausea or vomiting             Bleeding is light             Pain controlled with Motrin             Up ad lib / ambulatory / voiding QS  Newborn formula feeding    Requesting Depo injection prior to discharge  Denies HA, visual disturbances, epigastric pain   O:               VS: BP 144/80 mmHg  Pulse 58  Temp(Src) 97.9 F (36.6 C) (Oral)  Resp 18  Ht 5' 3" (1.6 m)  Wt 71.215 kg (157 lb)  BMI 27.82 kg/m2  SpO2 100%  LMP 07/11/2014  Breastfeeding? Unknown   LABS:              Recent Labs  05/01/15 0631 05/01/15 2124  WBC 9.8 8.8  HGB 10.5* 11.4*  PLT 175 199               Blood type: --/--/AB POS (09/22 0934)  Rubella:    Non-Immune                   I&O: Intake/Output      09/24 0701 - 09/25 0700 09/25 0701 - 09/26 0700   P.O. 1115    I.V. (mL/kg) 1375 (19.3)    Total Intake(mL/kg) 2490 (35)    Urine (mL/kg/hr) 3550 (2.1)    Total Output 3550     Net -1060                       Physical Exam:             Alert and oriented X3  Lungs: Clear and unlabored  Heart: regular rate and rhythm / no mumurs  Abdomen: soft, non-tender, non-distended              Fundus: firm, non-tender, U-2   Perineum: intact, no erythema, no edema, no ecchymosis  Lochia: scant, no clots  Extremities: no edema, no calf pain or tenderness /+2DTRs, no clonus     A: PPD # 3, SVD   Doing well - stable status  Rubella Non-Immune  Pre-eclampsia workup WNL  Likely Chronic Hypertension - blood pressures improving overall, with 1 outlier 161/94 after activity   P: Routine post partum orders  Discharge home today  MMR vaccine prior to discharge  Depo 150mg IM injection today  Procardia 30mg XL daily   Worsening s/s reviewed and postpartum pre-eclampsia symptoms reviewed   Discharge instructions reviewed and all questions answered   F/U at  [facility] in 1 week for BP check, then in 6 weeks for PP visit  Meredith C Sigmon, CNM  

## 2015-05-03 NOTE — Discharge Summary (Signed)
Obstetric Discharge Summary Reason for Admission: onset of labor at 38+5 weeks Prenatal Complications:Trichomoniasis diagnosed 10/01/14,tx with Metronidazole, TOC on 12/17/14 neg, +drug screen for Marijuana, Rubella non-immune, GBS +, Tobacco abuse (2-3 cigs a day) Intrapartum Procedures: spontaneous vaginal delivery Postpartum Procedures: 24-hour course of Magnesium Sulfate d/t severe blood pressures and HA; Rubella Non-Immune Complications-Operative and Postpartum: r/o PP Pre-eclampsia with 24 hour Magnesiu Sulfate / suspect chronic hypertension and discharged on Procardia HEMOGLOBIN  Date Value Ref Range Status  05/01/2015 11.4* 12.0 - 16.0 g/dL Final   HGB  Date Value Ref Range Status  05/14/2012 11.6* 12.0-16.0 g/dL Final   HCT  Date Value Ref Range Status  05/01/2015 34.2* 35.0 - 47.0 % Final  05/14/2012 33.8* 35.0-47.0 % Final    Brief Hospital Course  Carly Stewart is a [id number] who had a SVD on 04/30/15;  for further details of this delivery, please refer to the delivery note.  Patient had elevated blood pressures during labor requiring IV Labetalol x 2 with persistent elevated BP's late on PPD #1.  Pt. Received 24 hours of Magnesium Sulfate.  Blood pressures are more stable and started her on Procardia  XL daily on 05/03/15.  By time of discharge on PPD#3, her pain was controlled on oral pain medications; she had appropriate lochia and was ambulating, voiding without difficulty and tolerating regular diet.  She was deemed stable for discharge to home.  Physical Exam:  General: alert, cooperative and no distress Lochia: scant, not clots Uterine Fundus: firm, U-2 DVT Evaluation: No evidence of DVT seen on physical exam. No cords or calf tenderness. No significant calf/ankle edema. +2 DTRs with no clonus   Discharge Diagnoses: Term Pregnancy-delivered  Discharge Information: Date: 05/03/2015 Activity: pelvic rest / no intercourse x 6 weeks  Diet: routine and Low  sodium Medications: PNV, Ibuprofen and Procardia  XL Condition: stable and improved  Postpartum Contraception: Depo injection  IM x 1 on day of discharge  Instructions:  Discharge Instructions    Activity as tolerated    Complete by:  As directed      Call MD for:  difficulty breathing, headache or visual disturbances    Complete by:  As directed      Call MD for:  extreme fatigue    Complete by:  As directed      Call MD for:  hives    Complete by:  As directed      Call MD for:  persistant dizziness or light-headedness    Complete by:  As directed      Call MD for:  persistant nausea and vomiting    Complete by:  As directed      Call MD for:  redness, tenderness, or signs of infection (pain, swelling, redness, odor or green/yellow discharge around incision site)    Complete by:  As directed      Call MD for:  severe uncontrolled pain    Complete by:  As directed      Call MD for:  temperature >100.4    Complete by:  As directed      Call MD for:    Complete by:  As directed   S/S of depression or mood instability / inability to care for herself or baby / or suicidal ideation     Diet - low sodium heart healthy    Complete by:  As directed      Sexual acrtivity    Complete by:  As directed   No  intercourse x 6 weeks          Discharge to: home Follow-up Information    Follow up with [facility] In 1 week.   Why:  BP Check, then schedule 6 week PP visit    Contact information:   [address] [place] [address] [phone]      Continue Depo injections every 3 month  Newborn Data: Live born female  Birth Weight: 7 lb 0.9 oz (3201 g) APGAR: 9, 9 Formula Feeding Only   Home with mother.  Carly Stewart 05/03/2015, 12:31 PM

## 2015-05-03 NOTE — Discharge Instructions (Signed)
Hypertension °Hypertension, commonly called high blood pressure, is when the force of blood pumping through your arteries is too strong. Your arteries are the blood vessels that carry blood from your heart throughout your body. A blood pressure reading consists of a higher number over a lower number, such as 110/72. The higher number (systolic) is the pressure inside your arteries when your heart pumps. The lower number (diastolic) is the pressure inside your arteries when your heart relaxes. Ideally you want your blood pressure below 120/80. °Hypertension forces your heart to work harder to pump blood. Your arteries may become narrow or stiff. Having hypertension puts you at risk for heart disease, stroke, and other problems.  °RISK FACTORS °Some risk factors for high blood pressure are controllable. Others are not.  °Risk factors you cannot control include:  °· Race. You may be at higher risk if you are African American. °· Age. Risk increases with age. °· Gender. Men are at higher risk than women before age 45 years. After age 65, women are at higher risk than men. °Risk factors you can control include: °· Not getting enough exercise or physical activity. °· Being overweight. °· Getting too much fat, sugar, calories, or salt in your diet. °· Drinking too much alcohol. °SIGNS AND SYMPTOMS °Hypertension does not usually cause signs or symptoms. Extremely high blood pressure (hypertensive crisis) may cause headache, anxiety, shortness of breath, and nosebleed. °DIAGNOSIS  °To check if you have hypertension, your health care provider will measure your blood pressure while you are seated, with your arm held at the level of your heart. It should be measured at least twice using the same arm. Certain conditions can cause a difference in blood pressure between your right and left arms. A blood pressure reading that is higher than normal on one occasion does not mean that you need treatment. If one blood pressure reading  is high, ask your health care provider about having it checked again. °TREATMENT  °Treating high blood pressure includes making lifestyle changes and possibly taking medicine. Living a healthy lifestyle can help lower high blood pressure. You may need to change some of your habits. °Lifestyle changes may include: °· Following the DASH diet. This diet is high in fruits, vegetables, and whole grains. It is low in salt, red meat, and added sugars. °· Getting at least 2½ hours of brisk physical activity every week. °· Losing weight if necessary. °· Not smoking. °· Limiting alcoholic beverages. °· Learning ways to reduce stress. ° If lifestyle changes are not enough to get your blood pressure under control, your health care provider may prescribe medicine. You may need to take more than one. Work closely with your health care provider to understand the risks and benefits. °HOME CARE INSTRUCTIONS °· Have your blood pressure rechecked as directed by your health care provider.   °· Take medicines only as directed by your health care provider. Follow the directions carefully. Blood pressure medicines must be taken as prescribed. The medicine does not work as well when you skip doses. Skipping doses also puts you at risk for problems.   °· Do not smoke.   °· Monitor your blood pressure at home as directed by your health care provider.  °SEEK MEDICAL CARE IF:  °· You think you are having a reaction to medicines taken. °· You have recurrent headaches or feel dizzy. °· You have swelling in your ankles. °· You have trouble with your vision. °SEEK IMMEDIATE MEDICAL CARE IF: °· You develop a severe headache or confusion. °·   You have unusual weakness, numbness, or feel faint.  You have severe chest or abdominal pain.  You vomit repeatedly.  You have trouble breathing. MAKE SURE YOU:   Understand these instructions.  Will watch your condition.  Will get help right away if you are not doing well or get worse. Document  Released: 07/25/2005 Document Revised: 12/09/2013 Document Reviewed: 05/17/2013 [facility] Patient Information 2015 [place], [place]. This information is not intended to replace advice given to you by your health care provider. Make sure you discuss any questions you have with your health care provider. Postpartum Care After Vaginal Delivery After you deliver your newborn (postpartum period), the usual stay in the hospital is 24-72 hours. If there were problems with your labor or delivery, or if you have other medical problems, you might be in the hospital longer.  While you are in the hospital, you will receive help and instructions on how to care for yourself and your newborn during the postpartum period.  While you are in the hospital:  Be sure to tell your nurses if you have pain or discomfort, as well as where you feel the pain and what makes the pain worse.  If you had an incision made near your vagina (episiotomy) or if you had some tearing during delivery, the nurses may put ice packs on your episiotomy or tear. The ice packs may help to reduce the pain and swelling.  If you are breastfeeding, you may feel uncomfortable contractions of your uterus for a couple of weeks. This is normal. The contractions help your uterus get back to normal size.  It is normal to have some bleeding after delivery.  For the first 1-3 days after delivery, the flow is red and the amount may be similar to a period.  It is common for the flow to start and stop.  In the first few days, you may pass some small clots. Let your nurses know if you begin to pass large clots or your flow increases.  Do not  flush blood clots down the toilet before having the nurse look at them.  During the next 3-10 days after delivery, your flow should become more watery and pink or brown-tinged in color.  Ten to fourteen days after delivery, your flow should be a small amount of yellowish-white discharge.  The amount of your flow  will decrease over the first few weeks after delivery. Your flow may stop in 6-8 weeks. Most women have had their flow stop by 12 weeks after delivery.  You should change your sanitary pads frequently.  Wash your hands thoroughly with soap and water for at least 20 seconds after changing pads, using the toilet, or before holding or feeding your newborn.  You should feel like you need to empty your bladder within the first 6-8 hours after delivery.  In case you become weak, lightheaded, or faint, call your nurse before you get out of bed for the first time and before you take a shower for the first time.  Within the first few days after delivery, your breasts may begin to feel tender and full. This is called engorgement. Breast tenderness usually goes away within 48-72 hours after engorgement occurs. You may also notice milk leaking from your breasts. If you are not breastfeeding, do not stimulate your breasts. Breast stimulation can make your breasts produce more milk.  Spending as much time as possible with your newborn is very important. During this time, you and your newborn can feel close  and get to know each other. Having your newborn stay in your room (rooming in) will help to strengthen the bond with your newborn. It will give you time to get to know your newborn and become comfortable caring for your newborn.  Your hormones change after delivery. Sometimes the hormone changes can temporarily cause you to feel sad or tearful. These feelings should not last more than a few days. If these feelings last longer than that, you should talk to your caregiver.  If desired, talk to your caregiver about methods of family planning or contraception.  Talk to your caregiver about immunizations. Your caregiver may want you to have the following immunizations before leaving the hospital:  Tetanus, diphtheria, and pertussis (Tdap) or tetanus and diphtheria (Td) immunization. It is very important that you  and your family (including grandparents) or others caring for your newborn are up-to-date with the Tdap or Td immunizations. The Tdap or Td immunization can help protect your newborn from getting ill.  Rubella immunization.  Varicella (chickenpox) immunization.  Influenza immunization. You should receive this annual immunization if you did not receive the immunization during your pregnancy. Document Released: 05/22/2007 Document Revised: 04/18/2012 Document Reviewed: 03/21/2012 [facility] Patient Information 2015 [place], [place]. This information is not intended to replace advice given to you by your health care provider. Make sure you discuss any questions you have with your health care provider.

## 2015-11-05 ENCOUNTER — Emergency Department
Admission: EM | Admit: 2015-11-05 | Discharge: 2015-11-05 | Disposition: A | Discharge status: Home / Self Care | Payer: BLUE CROSS/BLUE SHIELD | Attending: Emergency Medicine | Admitting: Emergency Medicine

## 2015-11-05 ENCOUNTER — Encounter: Payer: Self-pay | Admitting: Emergency Medicine

## 2015-11-05 DIAGNOSIS — Z87891 Personal history of nicotine dependence: Secondary | ICD-10-CM | POA: Insufficient documentation

## 2015-11-05 DIAGNOSIS — M67431 Ganglion, right wrist: Secondary | ICD-10-CM

## 2015-11-05 DIAGNOSIS — Z79899 Other long term (current) drug therapy: Secondary | ICD-10-CM | POA: Insufficient documentation

## 2015-11-05 DIAGNOSIS — M67432 Ganglion, left wrist: Secondary | ICD-10-CM | POA: Insufficient documentation

## 2015-11-05 DIAGNOSIS — L729 Follicular cyst of the skin and subcutaneous tissue, unspecified: Secondary | ICD-10-CM | POA: Diagnosis present

## 2015-11-05 MED ORDER — NAPROXEN 500 MG PO TABS: 500.0000 mg | ORAL_TABLET | Freq: Two times a day (BID) | ORAL | Status: DC

## 2015-11-05 MED ORDER — TRAMADOL HCL 50 MG PO TABS: 50.0000 mg | ORAL_TABLET | Freq: Four times a day (QID) | ORAL | Status: AC | PRN

## 2015-11-05 NOTE — ED Provider Notes (Signed)
[facility] Emergency Department Provider Note  ____________________________________________  Time seen: Approximately 7:14 AM  I have reviewed the triage vital signs and the nursing notes.   HISTORY  Chief Complaint Cyst    HPI Carly Stewart is a 30 y.o. female is complaining of a nodule lesion dorsal aspect of left wrist for 6 years. Patient stating the past 4 days is been experiencing pain to the area with flexion and extension. Patient denies any provocative incident for her complaint. No palliative measures taken for this complaint. Patient is rating the pain as a 10 over 10. Patient describes the pain as "achy". Patient is right-hand dominant.   History reviewed. No pertinent past medical history.  Patient Active Problem List   Diagnosis Date Noted  . Postpartum care following vaginal delivery (04/30/15) 05/03/2015  . Benign essential hypertension with delivery 05/03/2015    History reviewed. No pertinent past surgical history.  Current Outpatient Rx  Name  Route  Sig  Dispense  Refill  . ibuprofen (ADVIL,MOTRIN) 600 MG tablet   Oral   Take 1 tablet (600 mg total) by mouth every 6 (six) hours.   30 tablet   0   . medroxyPROGESTERone (DEPO-PROVERA) 150 MG/ML injection   Intramuscular   Inject 1 mL (150 mg total) into the muscle every 3 (three) months.   1 mL   4   . naproxen (NAPROSYN) 500 MG tablet   Oral   Take 1 tablet (500 mg total) by mouth 2 (two) times daily with a meal.   20 tablet   0   . NIFEdipine (PROCARDIA-XL/ADALAT CC) 30 MG 24 hr tablet   Oral   Take 1 tablet (30 mg total) by mouth daily.   30 tablet   0   . Prenatal Vit-Fe Fumarate-FA (MULTIVITAMIN-PRENATAL) 27-0.8 MG TABS tablet   Oral   Take 1 tablet by mouth daily at 12 noon.         . traMADol (ULTRAM) 50 MG tablet   Oral   Take 1 tablet (50 mg total) by mouth every 6 (six) hours as needed.   20 tablet   0     Allergies Review of patient's  allergies indicates no known allergies.  No family history on file.  Social History Social History  Substance Use Topics  . Smoking status: Former Smoker -- 0.50 packs/day    Types: Cigarettes  . Smokeless tobacco: Never Used  . Alcohol Use: No    Review of Systems Constitutional: No fever/chills Eyes: No visual changes. ENT: No sore throat. Cardiovascular: Denies chest pain. Respiratory: Denies shortness of breath. Gastrointestinal: No abdominal pain.  No nausea, no vomiting.  No diarrhea.  No constipation. Genitourinary: Negative for dysuria. Musculoskeletal: Left wrist pain  Skin: Negative for rash. Nodule lesion left wrist Neurological: Negative for headaches, focal weakness or numbness.    ____________________________________________   PHYSICAL EXAM:  VITAL SIGNS: ED Triage Vitals  Enc Vitals Group     BP 11/05/15 0604 138/84 mmHg     Pulse Rate 11/05/15 0604 68     Resp 11/05/15 0604 18     Temp 11/05/15 0604 97.9 F (36.6 C)     Temp Source 11/05/15 0604 Oral     SpO2 11/05/15 0604 99 %     Weight 11/05/15 0604 130 lb (58.968 kg)     Height 11/05/15 0604  (1.6 m)     Head Cir --      Peak Flow --  Pain Score 11/05/15 0603 10     Pain Loc --      Pain Edu? --      Excl. in GC? --     Constitutional: Alert and oriented. Well appearing and in no acute distress. Eyes: Conjunctivae are normal. PERRL. EOMI. Head: Atraumatic. Nose: No congestion/rhinnorhea. Mouth/Throat: Mucous membranes are moist.  Oropharynx non-erythematous. Neck: No stridor. No cervical spine tenderness to palpation. Hematological/Lymphatic/Immunilogical: No cervical lymphadenopathy. Cardiovascular: Normal rate, regular rhythm. Grossly normal heart sounds.  Good peripheral circulation. Respiratory: Normal respiratory effort.  No retractions. Lungs CTAB. Gastrointestinal: Soft and nontender. No distention. No abdominal bruits. No CVA tenderness. Musculoskeletal: No obvious  deformity of the left wrist. No edema. Patient is moderate guarding palpation of a nodule lesion dose aspect of left wrist.. Neurologic:  Normal speech and language. No gross focal neurologic deficits are appreciated. No gait instability. Skin:  Skin is warm, dry and intact. No rash noted. Nodule lesion left wrist Psychiatric: Mood and affect are normal. Speech and behavior are normal.  ____________________________________________   LABS (all labs ordered are listed, but only abnormal results are displayed)  Labs Reviewed - No data to display ____________________________________________  EKG   ____________________________________________  RADIOLOGY   ____________________________________________   PROCEDURES  Procedure(s) performed: None  Critical Care performed: No  ____________________________________________   INITIAL IMPRESSION / ASSESSMENT AND PLAN / ED COURSE  Pertinent labs & imaging results that were available during my care of the patient were reviewed by me and considered in my medical decision making (see chart for details).  Ganglion cyst left wrist. ____________________________________________   FINAL CLINICAL IMPRESSION(S) / ED DIAGNOSES  Final diagnoses:  Ganglion cyst of wrist, right      Joni ReiningRonald K Osha Rane, PA-C 11/05/15 [id number]  Jene Everyobert Kinner, MD 11/05/15 [phone]

## 2015-11-05 NOTE — ED Notes (Signed)
See triage note  States she developed a "cyst' to same hand several years ago  Sx's had gotten better. Having pain to right hand  Small raised area noted to back of hand   Good pulses and sensation

## 2015-11-05 NOTE — ED Notes (Signed)
Patient ambulatory to triage with steady gait, without difficulty or distress noted; pt reports "cyst" to top of right hand for 6 years

## 2015-11-05 NOTE — Discharge Instructions (Signed)
Contact surgical clinic for appointment.

## 2015-11-26 ENCOUNTER — Encounter: Payer: Self-pay | Admitting: *Deleted

## 2015-12-04 ENCOUNTER — Ambulatory Visit
Admission: RE | Admit: 2015-12-04 | Discharge: 2015-12-04 | Disposition: A | Discharge status: Home / Self Care | Payer: BLUE CROSS/BLUE SHIELD | Source: Ambulatory Visit | Attending: Unknown Physician Specialty | Admitting: Unknown Physician Specialty

## 2015-12-04 ENCOUNTER — Ambulatory Visit: Payer: BLUE CROSS/BLUE SHIELD | Admitting: Anesthesiology

## 2015-12-04 ENCOUNTER — Encounter
Admission: RE | Disposition: A | Discharge status: Home / Self Care | Payer: Self-pay | Source: Ambulatory Visit | Attending: Unknown Physician Specialty

## 2015-12-04 DIAGNOSIS — M779 Enthesopathy, unspecified: Secondary | ICD-10-CM | POA: Insufficient documentation

## 2015-12-04 DIAGNOSIS — R2231 Localized swelling, mass and lump, right upper limb: Secondary | ICD-10-CM | POA: Diagnosis present

## 2015-12-04 DIAGNOSIS — Z79891 Long term (current) use of opiate analgesic: Secondary | ICD-10-CM | POA: Insufficient documentation

## 2015-12-04 DIAGNOSIS — Z791 Long term (current) use of non-steroidal anti-inflammatories (NSAID): Secondary | ICD-10-CM | POA: Insufficient documentation

## 2015-12-04 HISTORY — DX: Essential (primary) hypertension: I10

## 2015-12-04 HISTORY — PX: MASS EXCISION: SHX2000

## 2015-12-04 SURGERY — EXCISION MASS
Anesthesia: General | Site: Hand | Laterality: Right | Wound class: Clean

## 2015-12-04 MED ORDER — MIDAZOLAM HCL 5 MG/5ML IJ SOLN
INTRAMUSCULAR | Status: DC | PRN
  Administered 2015-12-04: 2 mg via INTRAVENOUS

## 2015-12-04 MED ORDER — OXYCODONE HCL 5 MG/5ML PO SOLN: 5.0000 mg | Freq: Once | ORAL | Status: AC | PRN

## 2015-12-04 MED ORDER — ONDANSETRON HCL 4 MG/2ML IJ SOLN
INTRAMUSCULAR | Status: DC | PRN
  Administered 2015-12-04: 4 mg via INTRAVENOUS

## 2015-12-04 MED ORDER — MEPERIDINE HCL 25 MG/ML IJ SOLN: 6.2500 mg | INTRAMUSCULAR | Status: DC | PRN

## 2015-12-04 MED ORDER — NORCO 5-325 MG PO TABS: 1.0000 | ORAL_TABLET | Freq: Four times a day (QID) | ORAL | Status: DC | PRN

## 2015-12-04 MED ORDER — BUPIVACAINE HCL 0.5 % IJ SOLN
INTRAMUSCULAR | Status: DC | PRN
  Administered 2015-12-04: 8 mL

## 2015-12-04 MED ORDER — LIDOCAINE HCL (CARDIAC) 20 MG/ML IV SOLN
INTRAVENOUS | Status: DC | PRN
  Administered 2015-12-04: 30 mg via INTRATRACHEAL

## 2015-12-04 MED ORDER — PROMETHAZINE HCL 25 MG/ML IJ SOLN: 6.2500 mg | INTRAMUSCULAR | Status: DC | PRN

## 2015-12-04 MED ORDER — HYDROMORPHONE HCL 1 MG/ML IJ SOLN: 0.2500 mg | INTRAMUSCULAR | Status: DC | PRN

## 2015-12-04 MED ORDER — FENTANYL CITRATE (PF) 100 MCG/2ML IJ SOLN
INTRAMUSCULAR | Status: DC | PRN
  Administered 2015-12-04: 100 ug via INTRAVENOUS

## 2015-12-04 MED ORDER — LACTATED RINGERS IV SOLN
INTRAVENOUS | Status: DC
  Administered 2015-12-04: 10:00:00 via INTRAVENOUS

## 2015-12-04 MED ORDER — PROPOFOL 10 MG/ML IV BOLUS
INTRAVENOUS | Status: DC | PRN
  Administered 2015-12-04: 200 mg via INTRAVENOUS

## 2015-12-04 MED ORDER — OXYCODONE HCL 5 MG PO TABS
5.0000 mg | ORAL_TABLET | Freq: Once | ORAL | Status: AC | PRN
  Administered 2015-12-04: 5 mg via ORAL

## 2015-12-04 MED ORDER — DEXAMETHASONE SODIUM PHOSPHATE 4 MG/ML IJ SOLN
INTRAMUSCULAR | Status: DC | PRN
  Administered 2015-12-04: 4 mg via INTRAVENOUS

## 2015-12-04 SURGICAL SUPPLY — 30 items
BANDAGE ELASTIC 2 LF NS (GAUZE/BANDAGES/DRESSINGS) ×3 IMPLANT
BENZOIN TINCTURE PRP APPL 2/3 (GAUZE/BANDAGES/DRESSINGS) ×3 IMPLANT
BNDG ESMARK 4X12 TAN STRL LF (GAUZE/BANDAGES/DRESSINGS) ×3 IMPLANT
CANISTER SUCT 1200ML W/VALVE (MISCELLANEOUS) IMPLANT
CLOSURE WOUND 1/4X4 (GAUZE/BANDAGES/DRESSINGS) ×1
COVER LIGHT HANDLE UNIVERSAL (MISCELLANEOUS) ×6 IMPLANT
CUFF TOURN SGL QUICK 18 (TOURNIQUET CUFF) ×3 IMPLANT
DURAPREP 26ML APPLICATOR (WOUND CARE) ×3 IMPLANT
GAUZE SPONGE 4X4 12PLY STRL (GAUZE/BANDAGES/DRESSINGS) ×3 IMPLANT
GLOVE BIO SURGEON STRL SZ7.5 (GLOVE) ×6 IMPLANT
GLOVE BIO SURGEON STRL SZ8 (GLOVE) ×6 IMPLANT
GLOVE INDICATOR 8.0 STRL GRN (GLOVE) ×6 IMPLANT
GOWN STRL REUS W/ TWL LRG LVL3 (GOWN DISPOSABLE) ×2 IMPLANT
GOWN STRL REUS W/TWL LRG LVL3 (GOWN DISPOSABLE) ×4
KIT ROOM TURNOVER OR (KITS) ×3 IMPLANT
NS IRRIG 500ML POUR BTL (IV SOLUTION) ×3 IMPLANT
PACK EXTREMITY ARMC (MISCELLANEOUS) ×3 IMPLANT
PAD GROUND ADULT SPLIT (MISCELLANEOUS) ×3 IMPLANT
PADDING CAST 2X4YD ST (MISCELLANEOUS) ×2
PADDING CAST BLEND 2X4 STRL (MISCELLANEOUS) ×1 IMPLANT
SOL PREP PVP 2OZ (MISCELLANEOUS) ×3
SOLUTION PREP PVP 2OZ (MISCELLANEOUS) ×1 IMPLANT
SPLINT CAST 1 STEP 3X12 (MISCELLANEOUS) ×3 IMPLANT
STOCKINETTE 4 (MISCELLANEOUS) ×3 IMPLANT
STRAP BODY AND KNEE 60X3 (MISCELLANEOUS) ×3 IMPLANT
STRIP CLOSURE SKIN 1/4X4 (GAUZE/BANDAGES/DRESSINGS) ×2 IMPLANT
SUT ETHILON 3-0 FS-10 30 BLK (SUTURE) ×3
SUT VIC AB 3-0 SH 27 (SUTURE) ×2
SUT VIC AB 3-0 SH 27X BRD (SUTURE) ×1 IMPLANT
SUTURE EHLN 3-0 FS-10 30 BLK (SUTURE) ×1 IMPLANT

## 2015-12-04 NOTE — Anesthesia Preprocedure Evaluation (Signed)
Anesthesia Evaluation  Patient identified by MRN, date of birth, ID band Patient awake    Reviewed: Allergy & Precautions, NPO status , Patient's Chart, lab work & pertinent test results  Airway Mallampati: I  TM Distance: >3 FB Neck ROM: Full    Dental no notable dental hx.    Pulmonary Current Smoker,    Pulmonary exam normal        Cardiovascular hypertension (gestational), negative cardio ROS Normal cardiovascular exam     Neuro/Psych negative neurological ROS  negative psych ROS   GI/Hepatic negative GI ROS, Neg liver ROS,   Endo/Other  negative endocrine ROS  Renal/GU negative Renal ROS     Musculoskeletal negative musculoskeletal ROS (+)   Abdominal   Peds  Hematology negative hematology ROS (+)   Anesthesia Other Findings   Reproductive/Obstetrics                             Anesthesia Physical Anesthesia Plan  ASA: I  Anesthesia Plan: General   Post-op Pain Management:    Induction: Intravenous  Airway Management Planned: LMA  Additional Equipment:   Intra-op Plan:   Post-operative Plan:   Informed Consent:   Plan Discussed with: CRNA  Anesthesia Plan Comments:         Anesthesia Quick Evaluation

## 2015-12-04 NOTE — Anesthesia Procedure Notes (Signed)
Procedure Name: LMA Insertion Date/Time: 12/04/2015 10:54 AM Performed by: Andee PolesBUSH, Carly Stewart Pre-anesthesia Checklist: Patient identified, Emergency Drugs available, Suction available, Timeout performed and Patient being monitored Patient Re-evaluated:Patient Re-evaluated prior to inductionOxygen Delivery Method: Circle system utilized Preoxygenation: Pre-oxygenation with 100% oxygen Intubation Type: IV induction LMA: LMA inserted LMA Size: 4.0 Number of attempts: 1 Placement Confirmation: positive ETCO2 and breath sounds checked- equal and bilateral Tube secured with: Tape

## 2015-12-04 NOTE — Anesthesia Postprocedure Evaluation (Signed)
Anesthesia Post Note  Patient: Carly Stewart  Procedure(s) Performed: Procedure(s) (LRB): RIGHT HAND EXCISION  CARPAL BOS (Right)  Patient location during evaluation: PACU Anesthesia Type: General Level of consciousness: awake and alert and oriented Pain management: pain level controlled Vital Signs Assessment: post-procedure vital signs reviewed and stable Respiratory status: spontaneous breathing and nonlabored ventilation Cardiovascular status: stable Postop Assessment: no signs of nausea or vomiting and adequate PO intake Anesthetic complications: no    Harolyn RutherfordJoshua Kirstin Kugler

## 2015-12-04 NOTE — H&P (Signed)
  H and P reviewed. No changes. Uploaded at later date. 

## 2015-12-04 NOTE — Transfer of Care (Signed)
Immediate Anesthesia Transfer of Care Note  Patient: Carly Stewart  Procedure(s) Performed: Procedure(s): RIGHT HAND EXCISION  CARPAL BOS (Right)  Patient Location: PACU  Anesthesia Type: General  Level of Consciousness: awake, alert  and patient cooperative  Airway and Oxygen Therapy: Patient Spontanous Breathing and Patient connected to supplemental oxygen  Post-op Assessment: Post-op Vital signs reviewed, Patient's Cardiovascular Status Stable, Respiratory Function Stable, Patent Airway and No signs of Nausea or vomiting  Post-op Vital Signs: Reviewed and stable  Complications: No apparent anesthesia complications

## 2015-12-04 NOTE — Op Note (Signed)
12/04/2015  12:09 PM  PATIENT:  Carly Stewart  30 y.o. female  PRE-OPERATIVE DIAGNOSIS:  RIGHT HAND MASS  POST-OPERATIVE DIAGNOSIS:  CARPAL BOSS RIGHT HAND  PROCEDURE:  Procedure(s): RIGHT HAND EXCISION  CARPAL BOS (Right)  SURGEON:   Isidoro DonningHarold Whitley Patchen, Jr., MD  ANESTHESIA: Gen.    HISTORY: Patient had a long history of a bony mass on the dorsum of the base her right second metacarpal. It had become somewhat symptomatic. The patient was desirous of having it excised. Consequently she was brought in for excision of the right wrist dorsal mass.  OP NOTE: The patient was taken to the operating room where satisfactory general anesthesia was achieved. A tourniquet was applied to right upper extremity. The right upper extremity was prepped and draped in usual fashion for procedure about the hand. Right upper extremity was exsanguinated and the tourniquet was inflated. About a 2 cm longitudinal incision was made over the dorsal base of the right second metacarpal. I bluntly dissected down to the bony mass. Care was taken to protect the extensor tendons. I went ahead and incised the periosteum over the bony mass with cautery and then I osteotomized the dorsal bony mass with a curved osteotome and a rongeur. I used a rasp to smooth over the osteotomized site. The tourniquet was released at this time. It was up for about 15 minutes. Bleeding was controlled with coagulation cautery. The periosteal tissue was closed with 2-0 Vicryl and the subcutaneous with 3-0 Vicryl. The skin was closed with a 3-0 nylon subcuticular suture that was reinforced with Steri-Strips. Sterile dressing was applied along with a fiberglass volar splint. The patient was then awakened and transferred to a stretcher bed. She was taken to the recovery room in satisfactory condition. Blood loss was negligible.

## 2015-12-04 NOTE — Discharge Instructions (Signed)
General Anesthesia, Adult, Care After Refer to this sheet in the next few weeks. These instructions provide you with information on caring for yourself after your procedure. Your health care provider may also give you more specific instructions. Your treatment has been planned according to current medical practices, but problems sometimes occur. Call your health care provider if you have any problems or questions after your procedure. WHAT TO EXPECT AFTER THE PROCEDURE After the procedure, it is typical to experience:  Sleepiness.  Nausea and vomiting. HOME CARE INSTRUCTIONS  For the first 24 hours after general anesthesia:  Have a responsible person with you.  Do not drive a car. If you are alone, do not take public transportation.  Do not drink alcohol.  Do not take medicine that has not been prescribed by your health care provider.  Do not sign important papers or make important decisions.  You may resume a normal diet and activities as directed by your health care provider.  Change bandages (dressings) as directed.  If you have questions or problems that seem related to general anesthesia, call the hospital and ask for the anesthetist or anesthesiologist on call. SEEK MEDICAL CARE IF:  You have nausea and vomiting that continue the day after anesthesia.  You develop a rash. SEEK IMMEDIATE MEDICAL CARE IF:   You have difficulty breathing.  You have chest pain.  You have any allergic problems.   This information is not intended to replace advice given to you by your health care provider. Make sure you discuss any questions you have with your health care provider.   Document Released: 10/31/2000 Document Revised: 08/15/2014 Document Reviewed: 11/23/2011 Elsevier Interactive Patient Education 2016 Elsevier [employer]  Ice pack  Elevation  RTC in about 10 days

## 2015-12-07 ENCOUNTER — Encounter: Payer: Self-pay | Admitting: Unknown Physician Specialty

## 2017-08-17 LAB — HM PAP SMEAR: HM Pap smear: NEGATIVE

## 2019-03-13 ENCOUNTER — Ambulatory Visit (LOCAL_COMMUNITY_HEALTH_CENTER): Payer: BC Managed Care – PPO

## 2019-03-13 ENCOUNTER — Other Ambulatory Visit: Payer: Self-pay

## 2019-03-13 VITALS — BP 107/69 | Ht 64.3 in | Wt 119.5 lb

## 2019-03-13 DIAGNOSIS — Z3009 Encounter for other general counseling and advice on contraception: Secondary | ICD-10-CM | POA: Diagnosis not present

## 2019-03-13 DIAGNOSIS — Z30013 Encounter for initial prescription of injectable contraceptive: Secondary | ICD-10-CM

## 2019-03-13 MED ORDER — MEDROXYPROGESTERONE ACETATE 150 MG/ML IM SUSP
150.0000 mg | Freq: Once | INTRAMUSCULAR | Status: AC
  Administered 2019-03-13: 15:00:00 150 mg via INTRAMUSCULAR

## 2019-03-13 NOTE — Progress Notes (Signed)
Last physical at [facility] 10/02/2018. Last depo at [facility] 12/19/2018; 12.0 weeks post depo. DMPA 150 mg IM today per Jerline Pain, FNP order dated 10/02/2018.

## 2019-06-05 ENCOUNTER — Other Ambulatory Visit: Payer: Self-pay

## 2019-06-05 ENCOUNTER — Ambulatory Visit (LOCAL_COMMUNITY_HEALTH_CENTER): Payer: BC Managed Care – PPO

## 2019-06-05 VITALS — BP 120/78 | Ht 64.0 in | Wt 118.0 lb

## 2019-06-05 DIAGNOSIS — Z30013 Encounter for initial prescription of injectable contraceptive: Secondary | ICD-10-CM | POA: Diagnosis not present

## 2019-06-05 DIAGNOSIS — Z3009 Encounter for other general counseling and advice on contraception: Secondary | ICD-10-CM | POA: Diagnosis not present

## 2019-06-05 MED ORDER — MEDROXYPROGESTERONE ACETATE 150 MG/ML IM SUSP
150.0000 mg | Freq: Once | INTRAMUSCULAR | Status: AC
  Administered 2019-06-05: 150 mg via INTRAMUSCULAR

## 2019-06-05 NOTE — Progress Notes (Signed)
Pt here for Depo today. Pt without issues with Depo and is satisfied with Depo as her BCM. Pt's last Depo was 03/13/2019 so pt is 12 weeks post last Depo. Pt received Depo 150mg  IM today per Jerline Pain, FNP order from 10/02/2018 physical. Pt tolerated well.Ronny Bacon, RN

## 2019-08-30 ENCOUNTER — Ambulatory Visit (LOCAL_COMMUNITY_HEALTH_CENTER): Payer: BC Managed Care – PPO

## 2019-08-30 ENCOUNTER — Other Ambulatory Visit: Payer: Self-pay

## 2019-08-30 ENCOUNTER — Encounter: Payer: Self-pay | Admitting: Family Medicine

## 2019-08-30 VITALS — BP 114/71 | Ht 64.0 in | Wt 118.6 lb

## 2019-08-30 DIAGNOSIS — Z3009 Encounter for other general counseling and advice on contraception: Secondary | ICD-10-CM | POA: Diagnosis not present

## 2019-08-30 MED ORDER — MEDROXYPROGESTERONE ACETATE 150 MG/ML IM SUSP
150.0000 mg | Freq: Once | INTRAMUSCULAR | Status: AC
  Administered 2019-08-30: 16:00:00 150 mg via INTRAMUSCULAR

## 2019-08-30 NOTE — Progress Notes (Signed)
In for Depo; denies complaints; informed will need provider visit for next Depo; adm. R. delt per 10/02/18 order by C. [place], [place];  Sharlette Dense, RN

## 2019-08-30 NOTE — Progress Notes (Signed)
I did not see pt. RN visit only.

## 2019-11-18 ENCOUNTER — Other Ambulatory Visit: Payer: Self-pay | Admitting: Physician Assistant

## 2019-11-18 DIAGNOSIS — Z3042 Encounter for surveillance of injectable contraceptive: Secondary | ICD-10-CM

## 2019-11-18 MED ORDER — MEDROXYPROGESTERONE ACETATE 150 MG/ML IM SUSP
150.0000 mg | INTRAMUSCULAR | Status: AC
  Administered 2019-11-19 – 2020-07-16 (×4): 150 mg via INTRAMUSCULAR

## 2019-11-18 NOTE — Progress Notes (Signed)
Per chart review, last RP 09/2018.  CBE and pap due 2022.  Provided that patient desires to continue with Depo, BP is normal and there are no changes to patient history, may continue with Depo .  Patient needs to sign abnl. FP and method consents if not already done.

## 2019-11-19 ENCOUNTER — Other Ambulatory Visit: Payer: Self-pay

## 2019-11-19 ENCOUNTER — Ambulatory Visit (LOCAL_COMMUNITY_HEALTH_CENTER): Payer: BC Managed Care – PPO

## 2019-11-19 VITALS — BP 107/73 | Ht 64.0 in | Wt 118.0 lb

## 2019-11-19 DIAGNOSIS — Z30013 Encounter for initial prescription of injectable contraceptive: Secondary | ICD-10-CM

## 2019-11-19 DIAGNOSIS — Z3009 Encounter for other general counseling and advice on contraception: Secondary | ICD-10-CM | POA: Diagnosis not present

## 2019-11-19 NOTE — Progress Notes (Signed)
Client taking gummy MVI daily. Depo administered per 11/18/2019 written order of Sadie Haber PA and client tolerated without complaint. [facility] consents signed as ordered. Jossie Ng, RN

## 2020-02-07 ENCOUNTER — Ambulatory Visit (LOCAL_COMMUNITY_HEALTH_CENTER): Payer: BC Managed Care – PPO

## 2020-02-07 ENCOUNTER — Other Ambulatory Visit: Payer: Self-pay

## 2020-02-07 VITALS — BP 123/86 | Ht 64.0 in | Wt 118.5 lb

## 2020-02-07 DIAGNOSIS — Z3009 Encounter for other general counseling and advice on contraception: Secondary | ICD-10-CM

## 2020-02-07 DIAGNOSIS — Z30013 Encounter for initial prescription of injectable contraceptive: Secondary | ICD-10-CM | POA: Diagnosis not present

## 2020-02-07 NOTE — Progress Notes (Signed)
Depo administered without difficulty per 11/18/2019 written order of Sadie Haber PA and client tolerated without complaint. Jossie Ng, RN

## 2020-04-28 ENCOUNTER — Ambulatory Visit (LOCAL_COMMUNITY_HEALTH_CENTER): Payer: BC Managed Care – PPO

## 2020-04-28 ENCOUNTER — Other Ambulatory Visit: Payer: Self-pay

## 2020-04-28 VITALS — BP 117/80 | Wt 122.5 lb

## 2020-04-28 DIAGNOSIS — Z3009 Encounter for other general counseling and advice on contraception: Secondary | ICD-10-CM

## 2020-04-28 DIAGNOSIS — Z30013 Encounter for initial prescription of injectable contraceptive: Secondary | ICD-10-CM | POA: Diagnosis not present

## 2020-05-04 NOTE — Progress Notes (Signed)
Depo given per C. Hampton PA order on 11/18/19. Tolerated well Richmond Campbell, RN

## 2020-06-29 ENCOUNTER — Emergency Department
Admission: EM | Admit: 2020-06-29 | Discharge: 2020-06-29 | Disposition: A | Discharge status: Home / Self Care | Payer: BC Managed Care – PPO | Attending: Emergency Medicine | Admitting: Emergency Medicine

## 2020-06-29 ENCOUNTER — Encounter: Payer: Self-pay | Admitting: Emergency Medicine

## 2020-06-29 ENCOUNTER — Other Ambulatory Visit: Payer: Self-pay

## 2020-06-29 DIAGNOSIS — R9431 Abnormal electrocardiogram [ECG] [EKG]: Secondary | ICD-10-CM | POA: Diagnosis not present

## 2020-06-29 DIAGNOSIS — R519 Headache, unspecified: Secondary | ICD-10-CM | POA: Insufficient documentation

## 2020-06-29 DIAGNOSIS — E876 Hypokalemia: Secondary | ICD-10-CM | POA: Diagnosis not present

## 2020-06-29 DIAGNOSIS — T50Z95A Adverse effect of other vaccines and biological substances, initial encounter: Secondary | ICD-10-CM | POA: Diagnosis not present

## 2020-06-29 DIAGNOSIS — R1110 Vomiting, unspecified: Secondary | ICD-10-CM | POA: Diagnosis present

## 2020-06-29 DIAGNOSIS — R112 Nausea with vomiting, unspecified: Secondary | ICD-10-CM | POA: Diagnosis not present

## 2020-06-29 DIAGNOSIS — Z87891 Personal history of nicotine dependence: Secondary | ICD-10-CM | POA: Diagnosis not present

## 2020-06-29 DIAGNOSIS — I10 Essential (primary) hypertension: Secondary | ICD-10-CM | POA: Insufficient documentation

## 2020-06-29 LAB — URINALYSIS, COMPLETE (UACMP) WITH MICROSCOPIC
Bilirubin Urine: NEGATIVE
Glucose, UA: NEGATIVE mg/dL
Ketones, ur: NEGATIVE mg/dL
Nitrite: NEGATIVE
Protein, ur: NEGATIVE mg/dL
Specific Gravity, Urine: 1.004 — ABNORMAL LOW (ref 1.005–1.030)
pH: 7 (ref 5.0–8.0)

## 2020-06-29 LAB — COMPREHENSIVE METABOLIC PANEL
ALT: 9 U/L (ref 0–44)
AST: 21 U/L (ref 15–41)
Albumin: 4.3 g/dL (ref 3.5–5.0)
Alkaline Phosphatase: 72 U/L (ref 38–126)
Anion gap: 9 (ref 5–15)
BUN: 6 mg/dL (ref 6–20)
CO2: 23 mmol/L (ref 22–32)
Calcium: 9 mg/dL (ref 8.9–10.3)
Chloride: 107 mmol/L (ref 98–111)
Creatinine, Ser: 0.76 mg/dL (ref 0.44–1.00)
GFR, Estimated: >60 mL/min (ref >60)
Glucose, Bld: 81 mg/dL (ref 70–99)
Potassium: 3.1 mmol/L — ABNORMAL LOW (ref 3.5–5.1)
Sodium: 139 mmol/L (ref 135–145)
Total Bilirubin: 0.7 mg/dL (ref 0.3–1.2)
Total Protein: 7.4 g/dL (ref 6.5–8.1)

## 2020-06-29 LAB — CBC WITH DIFFERENTIAL/PLATELET
Abs Immature Granulocytes: 0.01 10*3/uL (ref 0.00–0.07)
Basophils Absolute: 0 10*3/uL (ref 0.0–0.1)
Basophils Relative: 1 %
Eosinophils Absolute: 0.1 10*3/uL (ref 0.0–0.5)
Eosinophils Relative: 3 %
HCT: 35.8 % — ABNORMAL LOW (ref 36.0–46.0)
Hemoglobin: 12.2 g/dL (ref 12.0–15.0)
Immature Granulocytes: 0 %
Lymphocytes Relative: 63 %
Lymphs Abs: 1.7 10*3/uL (ref 0.7–4.0)
MCH: 31.6 pg (ref 26.0–34.0)
MCHC: 34.1 g/dL (ref 30.0–36.0)
MCV: 92.7 fL (ref 80.0–100.0)
Monocytes Absolute: 0.3 10*3/uL (ref 0.1–1.0)
Monocytes Relative: 11 %
Neutro Abs: 0.6 10*3/uL — ABNORMAL LOW (ref 1.7–7.7)
Neutrophils Relative %: 22 %
Platelets: 213 10*3/uL (ref 150–400)
RBC: 3.86 MIL/uL — ABNORMAL LOW (ref 3.87–5.11)
RDW: 12 % (ref 11.5–15.5)
Smear Review: NORMAL
WBC: 2.8 10*3/uL — ABNORMAL LOW (ref 4.0–10.5)
nRBC: 0 % (ref 0.0–0.2)

## 2020-06-29 LAB — POC URINE PREG, ED: Preg Test, Ur: NEGATIVE

## 2020-06-29 LAB — PATHOLOGIST SMEAR REVIEW

## 2020-06-29 MED ORDER — POTASSIUM CHLORIDE CRYS ER 20 MEQ PO TBCR
20.0000 meq | EXTENDED_RELEASE_TABLET | Freq: Once | ORAL | Status: AC
  Administered 2020-06-29: 20 meq via ORAL
  Filled 2020-06-29: qty 1

## 2020-06-29 MED ORDER — ONDANSETRON HCL 4 MG/2ML IJ SOLN
4.0000 mg | Freq: Once | INTRAMUSCULAR | Status: AC
  Administered 2020-06-29: 4 mg via INTRAVENOUS
  Filled 2020-06-29: qty 2

## 2020-06-29 MED ORDER — SULFAMETHOXAZOLE-TRIMETHOPRIM 800-160 MG PO TABS: 1.0000 | ORAL_TABLET | Freq: Two times a day (BID) | ORAL | 0 refills | Status: AC

## 2020-06-29 MED ORDER — SODIUM CHLORIDE 0.9 % IV BOLUS
1000.0000 mL | Freq: Once | INTRAVENOUS | Status: AC
  Administered 2020-06-29: 1000 mL via INTRAVENOUS

## 2020-06-29 MED ORDER — ONDANSETRON 4 MG PO TBDP: 4.0000 mg | ORAL_TABLET | Freq: Three times a day (TID) | ORAL | 0 refills | Status: DC | PRN

## 2020-06-29 MED ORDER — POTASSIUM CHLORIDE ER 10 MEQ PO TBCR: 20.0000 meq | EXTENDED_RELEASE_TABLET | Freq: Every day | ORAL | 0 refills | Status: DC

## 2020-06-29 NOTE — ED Notes (Signed)
Patient reports adverse reaction to 1st covid vaccine.

## 2020-06-29 NOTE — ED Provider Notes (Signed)
EKG is reviewed inter by me at 1300 Heart rate 60 QRS 80 QTc 400 Probable ectopic atrial rhythm.  No evidence of acute ischemia.  Question if potential underlying LVH.  No evidence of acute ischemia no prolonged QT  Patient having potassium repleted orally.  Will recommend outpatient cardiology follow-up.  Discussed case with PA   Sharyn Creamer, MD 06/29/20 1356

## 2020-06-29 NOTE — ED Triage Notes (Signed)
Pt via pov from home feeling "woozy" and vomiting after first dose of Moderna covid vaccine on Friday. Pt states she feels "weird" and that her arm hurts.  Denise sob. NAD noted.

## 2020-06-29 NOTE — ED Provider Notes (Signed)
[facility] Emergency Department Provider Note  ____________________________________________   First MD Initiated Contact with Patient 06/29/20 1116     (approximate)  I have reviewed the triage vital signs and the nursing notes.   HISTORY  Chief Complaint Emesis  HPI Carly Stewart is a 34 y.o. female who presents to the emergency department for evaluation of feeling "woozy" and weak since her first dose of her Materna Covid vaccine on 11/19.  She, she began having episodes of vomiting the first occurred last night.  She states that she has not kept anything down in approximately 24 hours.  She also endorses headache.  She denies fever, chest pain, shortness of breath, abdominal pain, dysuria.         Past Medical History:  Diagnosis Date  . Anemia   . Headache   . Hypertension    at end of pregnancy.(9/16)  OK now.    Patient Active Problem List   Diagnosis Date Noted  . Benign essential hypertension with delivery 05/03/2015    Past Surgical History:  Procedure Laterality Date  . MASS EXCISION Right 12/04/2015   Procedure: RIGHT HAND EXCISION  CARPAL BOS;  Surgeon: Erin Sons, MD;  Location: [facility];  Service: Orthopedics;  Laterality: Right;  . NO PAST SURGERIES      Prior to Admission medications   Medication Sig Start Date End Date Taking? Authorizing Provider  Multiple Vitamins-Minerals (WOMENS MULTIVITAMIN) TABS Take 1 tablet by mouth 1 day or 1 dose. Taking gummy vitamin    [provider]  naproxen (NAPROSYN) 500 MG tablet Take 1 tablet (500 mg total) by mouth 2 (two) times daily with a meal. Patient not taking: Reported on 03/13/2019 11/05/15   Joni Reining, PA-C  NORCO 5-325 MG tablet Take 1-2 tablets by mouth every 6 (six) hours as needed for moderate pain. MAXIMUM TOTAL ACETAMINOPHEN DOSE IS 4000 MG PER DAY Patient not taking: Reported on 03/13/2019 12/04/15   Erin Sons, MD  ondansetron (ZOFRAN ODT)  4 MG disintegrating tablet Take 1 tablet (4 mg total) by mouth every 8 (eight) hours as needed for nausea or vomiting. 06/29/20   Lucy Chris, PA  potassium chloride (KLOR-CON) 10 MEQ tablet Take 2 tablets (20 mEq total) by mouth daily for 3 days. 06/29/20 07/02/20  Lucy Chris, PA  Prenatal Vit-Fe Fumarate-FA (MULTIVITAMIN-PRENATAL) 27-0.8 MG TABS tablet Take 1 tablet by mouth daily at 12 noon. Patient not taking: Reported on 02/07/2020    [provider]  sulfamethoxazole-trimethoprim (BACTRIM DS) 800-160 MG tablet Take 1 tablet by mouth 2 (two) times daily for 10 days. 06/29/20 07/09/20  Lucy Chris, PA    Allergies Patient has no known allergies.  Family History  Problem Relation Age of Onset  . Diabetes Mother   . Hypertension Mother     Social History Social History   Tobacco Use  . Smoking status: Former Games developer  . Smokeless tobacco: Never Used  . Tobacco comment: used to smoke 2 "Black and Milds" every day  Substance Use Topics  . Alcohol use: Yes    Comment: 2 beers every other week.  . Drug use: No    Review of Systems Constitutional: + Malaise, no fever/chills Eyes: No visual changes. ENT: No sore throat. Cardiovascular: Denies chest pain. Respiratory: Denies shortness of breath. Gastrointestinal: No abdominal pain.  + vomiting.  No diarrhea.  No constipation. Genitourinary: Negative for dysuria. Musculoskeletal: Negative for back pain. Skin: Negative for rash.  Neurological: + headaches, negative for focal weakness or numbness.   ____________________________________________   PHYSICAL EXAM:  VITAL SIGNS: ED Triage Vitals  Enc Vitals Group     BP 06/29/20 0957 (!) 145/89     Pulse Rate 06/29/20 0957 (!) 55     Resp 06/29/20 0957 16     Temp 06/29/20 0957 98.5 F (36.9 C)     Temp Source 06/29/20 0957 Oral     SpO2 06/29/20 0957 100 %     Weight 06/29/20 0958 120 lb (54.4 kg)     Height 06/29/20 0958 5\' 3"  (1.6 m)     Head  Circumference --      Peak Flow --      Pain Score 06/29/20 1406 2     Pain Loc --      Pain Edu? --      Excl. in GC? --     Constitutional: Alert and oriented. Well appearing and in no acute distress. Eyes: Conjunctivae are normal. PERRL. EOMI. Head: Atraumatic. Nose: No congestion/rhinnorhea. Mouth/Throat: Mucous membranes are moist.  Oropharynx non-erythematous, no tonsillar enlargement. Neck: No stridor.  Lymphatic: No cervical lymphadenopathy.   Cardiovascular: Normal rate, regular rhythm. Grossly normal heart sounds.  Good peripheral circulation. Respiratory: Normal respiratory effort.  No retractions. Lungs CTAB. Gastrointestinal: Soft and nontender. No distention. No abdominal bruits. No CVA tenderness. Musculoskeletal: No lower extremity tenderness nor edema.  No joint effusions. Neurologic:  Normal speech and language. No gross focal neurologic deficits are appreciated. No gait instability. Skin:  Skin is warm, dry and intact. No rash noted. Psychiatric: Mood and affect are normal. Speech and behavior are normal.  ____________________________________________   LABS (all labs ordered are listed, but only abnormal results are displayed)  Labs Reviewed  COMPREHENSIVE METABOLIC PANEL - Abnormal; Notable for the following components:      Result Value   Potassium 3.1 (*)    All other components within normal limits  CBC WITH DIFFERENTIAL/PLATELET - Abnormal; Notable for the following components:   WBC 2.8 (*)    RBC 3.86 (*)    HCT 35.8 (*)    Neutro Abs 0.6 (*)    All other components within normal limits  URINALYSIS, COMPLETE (UACMP) WITH MICROSCOPIC - Abnormal; Notable for the following components:   Color, Urine STRAW (*)    APPearance HAZY (*)    Specific Gravity, Urine 1.004 (*)    Hgb urine dipstick SMALL (*)    Leukocytes,Ua MODERATE (*)    Bacteria, UA RARE (*)    All other components within normal limits  URINE CULTURE  PATHOLOGIST SMEAR REVIEW  POC  URINE PREG, ED   ____________________________________________  EKG  See Dr. 07/01/20 interpretation, no acute ischemia.  ____________________________________________   INITIAL IMPRESSION / ASSESSMENT AND PLAN / ED COURSE  As part of my medical decision making, I reviewed the following data within the electronic MEDICAL RECORD NUMBER Nursing notes reviewed and incorporated        Patient is a 34 year old female who presents to the emergency department for evaluation after feeling weak and "woozy" following her Covid vaccine on 11/19.  She also began vomiting last night.  See HPI for further details.  Physical exam is grossly unremarkable.  Laboratory evaluation began with CBC, CMP as well as urinalysis.  CBC reveals leukopenia with a count of 2.8, CMP is significant for potassium of 3.1.  Urinalysis suggests a dirty catch with 11-20 squamous epithelials present.  However, the patient does have a  moderate amount of leukocytes and given her vomiting, feel that it is worth treating her for UTI.  In addition, her work-up included an EKG with some abnormal findings.  This was discussed with Dr. Fanny Bien, who feels this is appropriate for outpatient cardiology follow-up and is likely incidental finding.  We will begin her replacement of potassium with 2 doses of 20 mEq today followed by 3 days outpatient.  Overall, the patient states that she is feeling better after IV fluids and Zofran.  Will give her a short course of outpatient Zofran as well as Bactrim for UTI and 3 days of potassium replacement.  Recommended close follow-up with primary care as well as outpatient cardiology.  The patient was amenable with this plan and will return to the emergency department if she experiences any acute worsening.   ____________________________________________   FINAL CLINICAL IMPRESSION(S) / ED DIAGNOSES  Final diagnoses:  Non-intractable vomiting with nausea, unspecified vomiting type  Vaccine reaction, initial  encounter  Hypokalemia  Abnormal EKG     ED Discharge Orders         Ordered    ondansetron (ZOFRAN ODT) 4 MG disintegrating tablet  Every 8 hours PRN        06/29/20 1359    potassium chloride (KLOR-CON) 10 MEQ tablet  Daily        06/29/20 1359    sulfamethoxazole-trimethoprim (BACTRIM DS) 800-160 MG tablet  2 times daily        06/29/20 1359          *Please note:  MELAYSIA STREED was evaluated in Emergency Department on 06/29/2020 for the symptoms described in the history of present illness. She was evaluated in the context of the global COVID-19 pandemic, which necessitated consideration that the patient might be at risk for infection with the SARS-CoV-2 virus that causes COVID-19. Institutional protocols and algorithms that pertain to the evaluation of patients at risk for COVID-19 are in a state of rapid change based on information released by regulatory bodies including the CDC and federal and state organizations. These policies and algorithms were followed during the patient's care in the ED.  Some ED evaluations and interventions may be delayed as a result of limited staffing during and the pandemic.*   Note:  This document was prepared using Dragon voice recognition software and may include unintentional dictation errors.    Lucy Chris, PA 06/29/20 Morene Crocker, MD 07/01/20 [phone]

## 2020-06-30 LAB — URINE CULTURE

## 2020-07-16 ENCOUNTER — Other Ambulatory Visit: Payer: Self-pay

## 2020-07-16 ENCOUNTER — Ambulatory Visit (LOCAL_COMMUNITY_HEALTH_CENTER): Payer: Medicaid Other

## 2020-07-16 VITALS — BP 119/75 | Ht 64.0 in | Wt 123.0 lb

## 2020-07-16 DIAGNOSIS — Z30013 Encounter for initial prescription of injectable contraceptive: Secondary | ICD-10-CM

## 2020-07-16 DIAGNOSIS — Z3009 Encounter for other general counseling and advice on contraception: Secondary | ICD-10-CM | POA: Diagnosis not present

## 2020-07-16 NOTE — Progress Notes (Signed)
11 weeks 2 days post depo. DMPA 150mg  given Left deltoid per order by C. Bolan, ST[place]ANGER dated 11/18/19. Tolerated well. Last physical 09/2018. Next depo and physical due 10/01/2020, pt aware. 10/03/2020, RN

## 2021-10-15 ENCOUNTER — Emergency Department: Payer: Managed Care, Other (non HMO)

## 2021-10-15 ENCOUNTER — Encounter: Payer: Self-pay | Admitting: Emergency Medicine

## 2021-10-15 ENCOUNTER — Other Ambulatory Visit: Payer: Self-pay

## 2021-10-15 ENCOUNTER — Emergency Department
Admission: EM | Admit: 2021-10-15 | Discharge: 2021-10-15 | Disposition: A | Discharge status: Home / Self Care | Payer: Managed Care, Other (non HMO) | Attending: Emergency Medicine | Admitting: Emergency Medicine

## 2021-10-15 DIAGNOSIS — G629 Polyneuropathy, unspecified: Secondary | ICD-10-CM | POA: Insufficient documentation

## 2021-10-15 DIAGNOSIS — R0781 Pleurodynia: Secondary | ICD-10-CM | POA: Diagnosis present

## 2021-10-15 DIAGNOSIS — R0789 Other chest pain: Secondary | ICD-10-CM | POA: Diagnosis not present

## 2021-10-15 DIAGNOSIS — I10 Essential (primary) hypertension: Secondary | ICD-10-CM | POA: Insufficient documentation

## 2021-10-15 MED ORDER — PREDNISONE 10 MG (21) PO TBPK: ORAL_TABLET | ORAL | 0 refills | Status: DC

## 2021-10-15 MED ORDER — METHOCARBAMOL 500 MG PO TABS: 500.0000 mg | ORAL_TABLET | Freq: Four times a day (QID) | ORAL | 0 refills | Status: DC

## 2021-10-15 NOTE — ED Provider Notes (Signed)
? ?[facility] ?Provider Note ? ? ? Event Date/Time  ? First MD Initiated Contact with Patient 10/15/21 [phone]   ?  (approximate) ? ? ?History  ? ?Numbness and Rib pain ? ? ?HPI ? ?Carly Stewart is a 36 y.o. female  with history of hypertension, anemia and as listed in EMR presents to the emergency department for evaluation of right side rib pain x 2 weeks. No known injury. She has also had tingling in both feet since having her Covid shot about a year ago. No alleviating measures prior to arrival. ? ?  ? ? ?Physical Exam  ? ?Triage Vital Signs: ?ED Triage Vitals  ?Enc Vitals Group  ?   BP 10/15/21 0738 (!) 136/100  ?   Pulse Rate 10/15/21 0738 76  ?   Resp 10/15/21 0738 18  ?   Temp 10/15/21 0738 98.7 ?F (37.1 ?C)  ?   Temp Source 10/15/21 0738 Oral  ?   SpO2 10/15/21 0738 100 %  ?   Weight 10/15/21 0730 123 lb 0.3 oz (55.8 kg)  ?   Height 10/15/21 0730 5\' 4"  (1.626 m)  ?   Head Circumference --   ?   Peak Flow --   ?   Pain Score 10/15/21 0730 8  ?   Pain Loc --   ?   Pain Edu? --   ?   Excl. in GC? --   ? ? ?Most recent vital signs: ?Vitals:  ? 10/15/21 0738 10/15/21 0853  ?BP: (!) 136/100 119/76  ?Pulse: 76 70  ?Resp: 18 18  ?Temp: 98.7 ?F (37.1 ?C)   ?SpO2: 100% 100%  ? ? ?General: Awake, no distress.  ?CV:  Good peripheral perfusion. DP and PT pulses 2+. ?Resp:  Normal effort. Breath sounds clear to auscultation. ?Abd:  No distention.  ?Other:   ? ? ?ED Results / Procedures / Treatments  ? ?Labs ?(all labs ordered are listed, but only abnormal results are displayed) ?Labs Reviewed - No data to display ? ? ?EKG ? ?Not indicated. ? ? ?RADIOLOGY ? ?Image and radiology report reviewed by me. ? ?Chest and right rib image negative for acute findings. ? ?PROCEDURES: ? ?Critical Care performed: No ? ?Procedures ? ? ?MEDICATIONS ORDERED IN ED: ?Medications - No data to display ? ? ?IMPRESSION / MDM / ASSESSMENT AND PLAN / ED COURSE  ? ?I have reviewed the triage note. ? ?Differential diagnosis  includes, but is not limited to, rib injury, costochondritis, pneumonia, PE ? ?36 year old female presenting to the emergency department for treatment and evaluation of nontraumatic right side chest wall/rib pain without known injury.  See HPI for further details.  She is also having some numbness and tingling that is intermittent in both of her feet since receiving her COVID vaccination approximately 1 year ago. ? ?Chest x-ray is reassuring.  PERC negative. ? ?Patient to be discharged home with a prescription for tapered prednisone for neuropathy and Robaxin for the chest wall pain.  She is to follow-up with primary care if her symptoms are not improving over the week or sooner if symptoms change or worsen.  ER return precautions discussed. ? ?  ? ? ?FINAL CLINICAL IMPRESSION(S) / ED DIAGNOSES  ? ?Final diagnoses:  ?Acute chest wall pain  ?Neuropathy  ? ? ? ?Rx / DC Orders  ? ?ED Discharge Orders   ? ?      Ordered  ?  methocarbamol (ROBAXIN) 500 MG tablet  4  times daily       ? 10/15/21 0848  ?  predniSONE (STERAPRED UNI-PAK 21 TAB) 10 MG (21) TBPK tablet       ? 10/15/21 0848  ? ?  ?  ? ?  ? ? ? ?Note:  This document was prepared using Dragon voice recognition software and may include unintentional dictation errors. ?  Chinita Pester, FNP ?10/15/21 4098 ? ?  ?Sharman Cheek, MD ?10/17/21 1508 ? ?

## 2021-10-15 NOTE — Discharge Instructions (Signed)
Please follow up with primary care if not improving over the week.  Return to the ER for symptoms that change or worsen if unable to schedule an appointment. 

## 2021-10-15 NOTE — ED Triage Notes (Signed)
Pt comes into the ED via POV c/o right rib pain that has been ongoing x 2 weeks and numbness and tingling in her feet that has been ongoing x 1 year.  Pt in NAD at this time with even and unlabored respirations and she is ambulatory to triage at this time.  ?

## 2021-10-15 NOTE — ED Notes (Signed)
See triage note  presents with pain to right side of ribs  states she feels like she is swollen on the right side  having pain with inspiration  denies any cough or fever ?

## 2021-12-23 ENCOUNTER — Ambulatory Visit (LOCAL_COMMUNITY_HEALTH_CENTER): Payer: Self-pay

## 2021-12-23 VITALS — BP 130/71 | Ht 63.0 in | Wt 114.5 lb

## 2021-12-23 DIAGNOSIS — Z3201 Encounter for pregnancy test, result positive: Secondary | ICD-10-CM

## 2021-12-23 LAB — PREGNANCY, URINE: Preg Test, Ur: POSITIVE — AB

## 2021-12-23 MED ORDER — PRENATAL VITAMINS 28-0.8 MG PO TABS: 28.0000 mg | ORAL_TABLET | Freq: Every day | ORAL | 0 refills | Status: AC

## 2021-12-23 NOTE — Progress Notes (Signed)
Positive UPT.  Plans prenatal care at [facility].  To clerk for preadmit.    Pregnancy information packet given and reviewed information with pt. Prenatal vitamins given.  Cherlynn Polo, RN

## 2021-12-31 ENCOUNTER — Other Ambulatory Visit: Payer: Self-pay

## 2021-12-31 ENCOUNTER — Emergency Department
Admission: EM | Admit: 2021-12-31 | Discharge: 2021-12-31 | Disposition: A | Discharge status: Home / Self Care | Payer: Managed Care, Other (non HMO) | Attending: Emergency Medicine | Admitting: Emergency Medicine

## 2021-12-31 ENCOUNTER — Encounter: Payer: Self-pay | Admitting: Medical Oncology

## 2021-12-31 DIAGNOSIS — Z87891 Personal history of nicotine dependence: Secondary | ICD-10-CM | POA: Diagnosis not present

## 2021-12-31 DIAGNOSIS — R3915 Urgency of urination: Secondary | ICD-10-CM | POA: Insufficient documentation

## 2021-12-31 DIAGNOSIS — I10 Essential (primary) hypertension: Secondary | ICD-10-CM | POA: Diagnosis not present

## 2021-12-31 DIAGNOSIS — Z3A01 Less than 8 weeks gestation of pregnancy: Secondary | ICD-10-CM | POA: Diagnosis not present

## 2021-12-31 DIAGNOSIS — O219 Vomiting of pregnancy, unspecified: Secondary | ICD-10-CM | POA: Insufficient documentation

## 2021-12-31 LAB — URINALYSIS, ROUTINE W REFLEX MICROSCOPIC
Bilirubin Urine: NEGATIVE
Glucose, UA: NEGATIVE mg/dL
Hgb urine dipstick: NEGATIVE
Ketones, ur: NEGATIVE mg/dL
Leukocytes,Ua: NEGATIVE
Nitrite: NEGATIVE
Protein, ur: 30 mg/dL — AB
Specific Gravity, Urine: 1.032 — ABNORMAL HIGH (ref 1.005–1.030)
pH: 5 (ref 5.0–8.0)

## 2021-12-31 LAB — POC URINE PREG, ED: Preg Test, Ur: POSITIVE — AB

## 2021-12-31 MED ORDER — ONDANSETRON 4 MG PO TBDP: 4.0000 mg | ORAL_TABLET | Freq: Three times a day (TID) | ORAL | 0 refills | Status: DC | PRN

## 2021-12-31 MED ORDER — ONDANSETRON 4 MG PO TBDP
4.0000 mg | ORAL_TABLET | Freq: Once | ORAL | Status: AC
  Administered 2021-12-31: 4 mg via ORAL
  Filled 2021-12-31: qty 1

## 2021-12-31 NOTE — ED Notes (Signed)
75 yof with a c/c of nausea and vomiting due to her pregnancy. Pt's pharmacy is at [facility] on [address]

## 2021-12-31 NOTE — Discharge Instructions (Signed)
Follow-up with your OB/GYN if any continued problems.  A prescription for Zofran was sent to the pharmacy to take every 8 hours as needed for nausea.  Drink lots of fluids to stay hydrated.  A culture was done of your urine.  Have your OB/GYN look at the results.  Eat small portions 6 times a day and avoid greasy or fried foods at this time.

## 2021-12-31 NOTE — ED Triage Notes (Signed)
Pt reports that she is approx 7 weeks preg, had it confirmed at the health dept but states that she has been having morning sickness. Reports that she wants zofran. Reports that she has been taking meclizine at home and its not helping. Denies any preg related complications at this moment. G5 P3.

## 2021-12-31 NOTE — ED Provider Notes (Signed)
[facility] Provider Note    Event Date/Time   First MD Initiated Contact with Patient 12/31/21 [phone]     (approximate)   History   Morning Sickness   HPI  Carly Stewart is a 36 y.o. female   presents to the ED with complaint of morning sickness.  Patient states that she has had nausea and vomiting and that the Ms. Doreene Nest that she has at home is not helping.  Patient had pregnancy test confirmed at the health department.  She denies any urinary symptoms but states that since she has been drinking more water she is urinating more often.  She is G5 P3.  History of anemia, hypertension and a former smoker.      Physical Exam   Triage Vital Signs: ED Triage Vitals  Enc Vitals Group     BP 12/31/21 0727 (!) 141/79     Pulse Rate 12/31/21 0727 63     Resp 12/31/21 0727 16     Temp 12/31/21 0727 97.8 F (36.6 C)     Temp Source 12/31/21 0727 Oral     SpO2 12/31/21 0727 100 %     Weight 12/31/21 0728 115 lb (52.2 kg)     Height 12/31/21 0728 5\' 3"  (1.6 m)     Head Circumference --      Peak Flow --      Pain Score 12/31/21 0728 0     Pain Loc --      Pain Edu? --      Excl. in [place]? --     Most recent vital signs: Vitals:   12/31/21 0727  BP: (!) 141/79  Pulse: 63  Resp: 16  Temp: 97.8 F (36.6 C)  SpO2: 100%     General: Awake, no distress.  CV:  Good peripheral perfusion.  Resp:  Normal effort.  Abd:  No distention.  Other:  Ambulatory without any assistance.   ED Results / Procedures / Treatments   Labs (all labs ordered are listed, but only abnormal results are displayed) Labs Reviewed  URINALYSIS, ROUTINE W REFLEX MICROSCOPIC - Abnormal; Notable for the following components:      Result Value   Color, Urine YELLOW (*)    APPearance CLOUDY (*)    Specific Gravity, Urine 1.032 (*)    Protein, ur 30 (*)    Bacteria, UA RARE (*)    All other components within normal limits  POC URINE PREG, ED - Abnormal; Notable for the  following components:   Preg Test, Ur Positive (*)    All other components within normal limits  URINE CULTURE      PROCEDURES:  Critical Care performed:   Procedures   MEDICATIONS ORDERED IN ED: Medications  ondansetron (ZOFRAN-ODT) disintegrating tablet 4 mg (4 mg Oral Given 12/31/21 0839)     IMPRESSION / MDM / ASSESSMENT AND PLAN / ED COURSE  I reviewed the triage vital signs and the nursing notes.   Differential diagnosis includes, but is not limited to, emesis secondary to pregnancy, urinary tract infection.   36 year old female presents to the ED with complaint of nausea and vomiting related to her pregnancy.  Patient states she is approximately [redacted] weeks pregnant and did have a pregnancy test done at the health department which was positive.  Urinalysis today showed 6-10 RBCs and 6-10 WBCs and rare bacteria which was not a clean-catch.  A culture was placed in the orders and patient was encouraged to follow-up with  her OB/GYN.  A prescription for Zofran was sent to the pharmacy and also 1 tablet dispensed in the ER.  She is encouraged to drink lots of fluids to stay hydrated and also eat 6 small meals per day and avoid fried or heavy food.       FINAL CLINICAL IMPRESSION(S) / ED DIAGNOSES   Final diagnoses:  Nausea and vomiting in pregnancy     Rx / DC Orders   ED Discharge Orders          Ordered    ondansetron (ZOFRAN-ODT) 4 MG disintegrating tablet  Every 8 hours PRN        12/31/21 0835             Note:  This document was prepared using Dragon voice recognition software and may include unintentional dictation errors.   Johnn Hai, PA-C 12/31/21 0845    Nena Polio, MD 12/31/21 1309

## 2022-01-01 LAB — URINE CULTURE: Culture: <10000 — AB

## 2022-01-05 ENCOUNTER — Emergency Department
Admission: EM | Admit: 2022-01-05 | Discharge: 2022-01-05 | Disposition: A | Discharge status: Home / Self Care | Payer: Managed Care, Other (non HMO) | Attending: Emergency Medicine | Admitting: Emergency Medicine

## 2022-01-05 ENCOUNTER — Emergency Department: Payer: Managed Care, Other (non HMO)

## 2022-01-05 ENCOUNTER — Encounter: Payer: Self-pay | Admitting: Medical Oncology

## 2022-01-05 DIAGNOSIS — O26891 Other specified pregnancy related conditions, first trimester: Secondary | ICD-10-CM | POA: Diagnosis present

## 2022-01-05 DIAGNOSIS — Z3A01 Less than 8 weeks gestation of pregnancy: Secondary | ICD-10-CM | POA: Insufficient documentation

## 2022-01-05 DIAGNOSIS — R110 Nausea: Secondary | ICD-10-CM | POA: Diagnosis not present

## 2022-01-05 DIAGNOSIS — O219 Vomiting of pregnancy, unspecified: Secondary | ICD-10-CM | POA: Diagnosis not present

## 2022-01-05 DIAGNOSIS — R109 Unspecified abdominal pain: Secondary | ICD-10-CM

## 2022-01-05 DIAGNOSIS — I10 Essential (primary) hypertension: Secondary | ICD-10-CM | POA: Insufficient documentation

## 2022-01-05 DIAGNOSIS — K5900 Constipation, unspecified: Secondary | ICD-10-CM | POA: Insufficient documentation

## 2022-01-05 DIAGNOSIS — R1011 Right upper quadrant pain: Secondary | ICD-10-CM | POA: Diagnosis not present

## 2022-01-05 DIAGNOSIS — R112 Nausea with vomiting, unspecified: Secondary | ICD-10-CM

## 2022-01-05 DIAGNOSIS — Z3491 Encounter for supervision of normal pregnancy, unspecified, first trimester: Secondary | ICD-10-CM

## 2022-01-05 LAB — CBC
HCT: 35.9 % — ABNORMAL LOW (ref 36.0–46.0)
Hemoglobin: 12.1 g/dL (ref 12.0–15.0)
MCH: 30.8 pg (ref 26.0–34.0)
MCHC: 33.7 g/dL (ref 30.0–36.0)
MCV: 91.3 fL (ref 80.0–100.0)
Platelets: 250 10*3/uL (ref 150–400)
RBC: 3.93 MIL/uL (ref 3.87–5.11)
RDW: 11.9 % (ref 11.5–15.5)
WBC: 7.5 10*3/uL (ref 4.0–10.5)
nRBC: 0 % (ref 0.0–0.2)

## 2022-01-05 LAB — URINALYSIS, ROUTINE W REFLEX MICROSCOPIC
Bilirubin Urine: NEGATIVE
Glucose, UA: NEGATIVE mg/dL
Hgb urine dipstick: NEGATIVE
Ketones, ur: 80 mg/dL — AB
Leukocytes,Ua: NEGATIVE
Nitrite: NEGATIVE
Protein, ur: 30 mg/dL — AB
Specific Gravity, Urine: 1.034 — ABNORMAL HIGH (ref 1.005–1.030)
pH: 5 (ref 5.0–8.0)

## 2022-01-05 LAB — COMPREHENSIVE METABOLIC PANEL
ALT: 7 U/L (ref 0–44)
AST: 16 U/L (ref 15–41)
Albumin: 4.1 g/dL (ref 3.5–5.0)
Alkaline Phosphatase: 82 U/L (ref 38–126)
Anion gap: 9 (ref 5–15)
BUN: 13 mg/dL (ref 6–20)
CO2: 23 mmol/L (ref 22–32)
Calcium: 9.5 mg/dL (ref 8.9–10.3)
Chloride: 100 mmol/L (ref 98–111)
Creatinine, Ser: 0.59 mg/dL (ref 0.44–1.00)
GFR, Estimated: >60 mL/min (ref >60)
Glucose, Bld: 87 mg/dL (ref 70–99)
Potassium: 3.7 mmol/L (ref 3.5–5.1)
Sodium: 132 mmol/L — ABNORMAL LOW (ref 135–145)
Total Bilirubin: 0.8 mg/dL (ref 0.3–1.2)
Total Protein: 7.4 g/dL (ref 6.5–8.1)

## 2022-01-05 LAB — LIPASE, BLOOD: Lipase: 25 U/L (ref 11–51)

## 2022-01-05 LAB — HCG, QUANTITATIVE, PREGNANCY: hCG, Beta Chain, Quant, S: 188861 m[IU]/mL — ABNORMAL HIGH (ref <5)

## 2022-01-05 MED ORDER — SODIUM CHLORIDE 0.9 % IV BOLUS
1000.0000 mL | Freq: Once | INTRAVENOUS | Status: AC
  Administered 2022-01-05: 1000 mL via INTRAVENOUS

## 2022-01-05 MED ORDER — ONDANSETRON 4 MG PO TBDP: 4.0000 mg | ORAL_TABLET | Freq: Four times a day (QID) | ORAL | 0 refills | Status: DC | PRN

## 2022-01-05 MED ORDER — ONDANSETRON HCL 4 MG/2ML IJ SOLN
4.0000 mg | INTRAMUSCULAR | Status: AC
  Administered 2022-01-05: 4 mg via INTRAVENOUS
  Filled 2022-01-05: qty 2

## 2022-01-05 NOTE — ED Notes (Signed)
Pt gone to US

## 2022-01-05 NOTE — ED Notes (Signed)
AVS with prescriptions provided to and discussed with patient. Pt verbalizes understanding of discharge instructions and denies any questions or concerns at this time. Pt ambulated out of department independently with steady gait. ? ?

## 2022-01-05 NOTE — Discharge Instructions (Addendum)
You were seen in the emergency department today for constipation.  We recommend that you use one or more of the following over-the-counter medications in the order described:   1)  Colace (or Dulcolax) 100 mg:  This is a stool softener, and you may take it once or twice a day as needed. 3)  Miralax (powder):  This medication works by drawing additional fluid into your intestines and helps to flush out your stool.  Mix the powder with water or juice according to label instructions.  It may help if the Colace and Senna are not sufficient, but you must be sure to use the recommended amount of water or juice when you mix up the powder. Remember that narcotic pain medications are constipating, so avoid them or minimize their use.  Drink plenty of fluids.  Please return to the Emergency Department immediately if you develop new or worsening symptoms that concern you, such as (but not limited to) fever > 101 degrees, severe abdominal pain, or persistent vomiting.

## 2022-01-05 NOTE — ED Provider Notes (Signed)
[facility] Provider Note    Event Date/Time   First MD Initiated Contact with Patient 01/05/22 0845     (approximate)   History   Emesis and Constipation   HPI  Carly Stewart is a 36 y.o. female with past medical history of anemia hypertension and reports 4 previous pregnancies  Today patient here for right-sided flank pain.  Is located in the right flank to right upper abdomen.  She has had some nausea and occasional vomiting.  Reports its been a few days since bowel movement.  She was recently seen in the ER has been using Zofran for nausea.  She is thirsty but reports having hard time keeping fluids down  No pain or burning with urination.  Pain is located mostly in the mid to right upper abdomen.  No fevers.  No vaginal bleeding or heavy discharge.  No lower abdominal pain or pelvic pain.  She has not had an ultrasound for this child yet.  Had urine culture taken 5 days ago (reviewed result, interpreted as no significant growth)  Currently reports pain is mild located in the right upper abdomen.  No trouble breathing.  No chest pain  Patient also reports she feels like this may be from constipation, she is felt fairly constipated has not had a normal bowel movement for about a week's time, nausea and a feeling of constipation accommodates.  Still passing gas     Physical Exam   Triage Vital Signs: ED Triage Vitals  Enc Vitals Group     BP 01/05/22 0734 120/73     Pulse Rate 01/05/22 0734 60     Resp 01/05/22 0734 16     Temp 01/05/22 0900 98.1 F (36.7 C)     Temp Source 01/05/22 0900 Oral     SpO2 01/05/22 0734 100 %     Weight 01/05/22 0735 114 lb 10.2 oz (52 kg)     Height 01/05/22 0735 5\' 3"  (1.6 m)     Head Circumference --      Peak Flow --      Pain Score 01/05/22 0734 8     Pain Loc --      Pain Edu? --      Excl. in GC? --     Most recent vital signs: Vitals:   01/05/22 0845 01/05/22 0900  BP: 115/70 119/77  Pulse:  (!) 55 (!) 56  Resp: 14 16  Temp:  98.1 F (36.7 C)  SpO2: 100% 100%     General: Awake, no distress.  Very pleasant CV:  Good peripheral perfusion.  Normal, regular.  Well-perfused Resp:  Normal effort.  No difficulty breathing.  Clear bilateral Abd:  No distention.  Moderate pain to palpation right upper quadrant, possibly positive Eulah PontMurphy that is somewhat equivocal.  Negative for peritonitis in the lower abdomen.  No peritonitis or rebound tenderness in any quadrant.  No rebound guarding or pain across the bilateral lower quadrant suprapubic space or left upper quadrant.  Ports pain along the right flank mostly centered in the right upper quadrant Other:  Not obviously gravid by exam.  Patient says notes she is about [redacted] weeks pregnant   ED Results / Procedures / Treatments   Labs (all labs ordered are listed, but only abnormal results are displayed) Labs Reviewed  HCG, QUANTITATIVE, PREGNANCY - Abnormal; Notable for the following components:      Result Value   hCG, Beta Nyra JabsChain, Quant, Kathie RhodesS [phone] (*)  All other components within normal limits  CBC - Abnormal; Notable for the following components:   HCT 35.9 (*)    All other components within normal limits  COMPREHENSIVE METABOLIC PANEL - Abnormal; Notable for the following components:   Sodium 132 (*)    All other components within normal limits  URINE CULTURE  LIPASE, BLOOD  URINALYSIS, ROUTINE W REFLEX MICROSCOPIC     EKG     RADIOLOGY   I personally interpreted the patient's abdominal ultrasound, negative for acute gross pathology on my review.   US Abdomen Complete  Result Date: 01/05/2022 CLINICAL DATA:  Right flank pain, nausea, vomiting.  Pregnant EXAM: ABDOMEN ULTRASOUND COMPLETE COMPARISON:  None Available. FINDINGS: Gallbladder: No gallstones or wall thickening visualized. No sonographic Murphy sign noted by sonographer. Common bile duct: Diameter: 2 mm Liver: No focal lesion identified. Within normal  limits in parenchymal echogenicity. Portal vein is patent on color Doppler imaging with normal direction of blood flow towards the liver. IVC: No abnormality visualized. Pancreas: Visualized portion unremarkable. Spleen: Size and appearance within normal limits. Right Kidney: Length: 10.3 cm. Echogenicity within normal limits. No mass or hydronephrosis visualized. Left Kidney: Length: 11.2 cm. Echogenicity within normal limits. No mass or hydronephrosis visualized. Abdominal aorta: No aneurysm visualized. Other findings: None. IMPRESSION: Normal abdominal ultrasound. Electronically Signed   By: Lesia Hausen M.D.   On: 01/05/2022 10:45   US OB LESS THAN 14 WEEKS WITH OB TRANSVAGINAL  Result Date: 01/05/2022 CLINICAL DATA:  Abdominal pain, nausea, vomiting. Concern for ectopic pregnancy EXAM: OBSTETRIC <14 WK [place] AND TRANSVAGINAL OB US TECHNIQUE: Both transabdominal and transvaginal ultrasound examinations were performed for complete evaluation of the gestation as well as the maternal uterus, adnexal regions, and pelvic cul-de-sac. Transvaginal technique was performed to assess early pregnancy. COMPARISON:  No prior study available from the current gestation. FINDINGS: Intrauterine gestational sac: Single Yolk sac:  Visualized. Embryo:  Visualized. Cardiac Activity: Visualized. Heart Rate: 158 bpm CRL: 14.5  Mm   7 w   5 d                  [place] EDC: 08/19/22 Subchorionic hemorrhage:  Small subchorionic hemorrhage is seen. Maternal uterus/adnexae: The right ovary measures 3.1 cm x 2.2 cm x 2.5 cm. A corpus luteum cyst is noted measuring up to 1.9 cm. The left ovary is normal measuring up to 2.5 cm x 2.5 cm x 2.4 cm. There is no free fluid in the pelvis. IMPRESSION: 1. Single live intrauterine pregnancy identified with an estimated gestational age of [redacted] weeks 5 days by crown-rump length. 2. Small subchorionic hemorrhage. Electronically Signed   By: Lesia Hausen M.D.   On: 01/05/2022 10:36     PROCEDURES:  Critical  Care performed: No  Procedures   MEDICATIONS ORDERED IN ED: Medications  sodium chloride 0.9 % bolus 1,000 mL (1,000 mLs Intravenous New Bag/Given 01/05/22 0917)  ondansetron (ZOFRAN) injection 4 mg (4 mg Intravenous Given 01/05/22 0917)     IMPRESSION / MDM / ASSESSMENT AND PLAN / ED COURSE  I reviewed the triage vital signs and the nursing notes.                              Differential diagnosis includes, but is not limited to, cholecystitis, cholelithiasis, hepatitis, constipation, seems unlikely but rule out ectopic pregnancy, nephrolithiasis urinary tract infection pyelonephritis colitis etc.  Reassuring exam except for focality of discomfort right  upper quadrant but no peritonitis on exam.  Afebrile normal white count.  Patient reports nausea and occasional vomiting has been present throughout much of this last couple weeks of her pregnancy, Zofran does help and she has been able to stay hydrated but occasionally vomiting.  She does report feeling constipated with last bowel movement about a week ago.  Suspect this may be the underlying cause, but wish to obtain imaging including ultrasound to evaluate for right upper quadrant pathology.  Low pretest probability for acute intra-abdominal domino pathology such as perforation  Patient's presentation is most consistent with acute illness / injury with system symptoms.  The patient is on the cardiac monitor to evaluate for evidence of arrhythmia and/or significant heart rate changes.  Clinical Course as of 01/05/22 1032  Wed Jan 05, 2022  0901 AB positive blood type according to prior records [MQ]    Clinical Course User Index [MQ] Sharyn Creamer, MD   Labs interpreted to this point includes normal CBC.  hCG 188,000 which would be appropriate given the patient's estimate about [redacted] weeks pregnant.  Very mild hyponatremia, receiving IV fluids likely secondary to mild dehydration.  Normal hepatic function testing and  lipase.  ----------------------------------------- 11:49 AM on 01/05/2022 ----------------------------------------- Patient reassured by her testing results this point.  Her results are reassuring.  I discussed with her, and she feels like the symptoms may be due to constipation.  At this point I do not think that there would be benefit to additional intra-abdominal imaging such as CT scan or MRI, rather discussed with the patient and she would like to try laxatives and MiraLAX at home, and see if having a bowel movement improves her symptoms.  Thinks is agreeable.  She has no clear signs that would suggest obstruction or acute intra-abdominal infection or perforation or acute peritonitis or surgical abdomen at this time.  Discussed with patient, she has planned follow-up with OB/GYN coming in June, in the interim of made urgent follow-up request to our OB.  Discussed careful return precautions, refilled her Zofran prescription, and patient comfortable with this plan.  Return precautions and treatment recommendations and follow-up discussed with the patient who is agreeable with the plan.     FINAL CLINICAL IMPRESSION(S) / ED DIAGNOSES   Final diagnoses:  Abdominal pain, unspecified abdominal location  Nausea and vomiting, unspecified vomiting type     Rx / DC Orders   ED Discharge Orders     None        Note:  This document was prepared using Dragon voice recognition software and may include unintentional dictation errors.   Sharyn Creamer, MD 01/05/22 1150

## 2022-01-05 NOTE — ED Triage Notes (Addendum)
Pt reports that she is approx 8 weeks preg and for 2 weeks she hasnt had a BM and has been vomiting. Pt has began having abd pain. She was recently here and given rx for zofran that has not helped.

## 2022-01-06 LAB — URINE CULTURE: Culture: <10000 — AB

## 2022-01-22 ENCOUNTER — Encounter: Payer: Self-pay | Admitting: Emergency Medicine

## 2022-01-22 ENCOUNTER — Emergency Department
Admission: EM | Admit: 2022-01-22 | Discharge: 2022-01-22 | Disposition: A | Discharge status: Home / Self Care | Payer: Medicaid Other | Attending: Emergency Medicine | Admitting: Emergency Medicine

## 2022-01-22 ENCOUNTER — Other Ambulatory Visit: Payer: Self-pay

## 2022-01-22 DIAGNOSIS — Z760 Encounter for issue of repeat prescription: Secondary | ICD-10-CM | POA: Diagnosis present

## 2022-01-22 MED ORDER — ONDANSETRON 4 MG PO TBDP
4.0000 mg | ORAL_TABLET | Freq: Four times a day (QID) | ORAL | 0 refills | Status: DC | PRN
Start: 2022-01-22 — End: 2022-02-10

## 2022-01-22 NOTE — ED Provider Notes (Signed)
[facility] Provider Note    Event Date/Time   First MD Initiated Contact with Patient 01/22/22 1021     (approximate)   History   Medication Refill   HPI  Carly Stewart is a 36 y.o. female who is approximately [redacted] weeks pregnant presents emergency department with requesting Zofran ODT.  Patient states that we had given her prescription and she has run out.  States she does not have an appointment until Thursday of this week.  This will be her first visit with the OB/GYN.  They will not give her medication prior to being seen.      Physical Exam   Triage Vital Signs: ED Triage Vitals  Enc Vitals Group     BP 01/22/22 1018 126/88     Pulse Rate 01/22/22 1018 64     Resp 01/22/22 1018 20     Temp 01/22/22 1018 98.4 F (36.9 C)     Temp Source 01/22/22 1018 Oral     SpO2 01/22/22 1018 100 %     Weight 01/22/22 1016 115 lb (52.2 kg)     Height 01/22/22 1016 5\' 3"  (1.6 m)     Head Circumference --      Peak Flow --      Pain Score 01/22/22 1016 0     Pain Loc --      Pain Edu? --      Excl. in GC? --     Most recent vital signs: Vitals:   01/22/22 1018  BP: 126/88  Pulse: 64  Resp: 20  Temp: 98.4 F (36.9 C)  SpO2: 100%     General: Awake, no distress.   CV:  Good peripheral perfusion. regular rate and  rhythm Resp:  Normal effort. Abd:  No distention.   Other:      ED Results / Procedures / Treatments   Labs (all labs ordered are listed, but only abnormal results are displayed) Labs Reviewed - No data to display   EKG     RADIOLOGY     PROCEDURES:   Procedures   MEDICATIONS ORDERED IN ED: Medications - No data to display   IMPRESSION / MDM / ASSESSMENT AND PLAN / ED COURSE  I reviewed the triage vital signs and the nursing notes.                              Differential diagnosis includes, but is not limited to, med refill, nausea and first trimester, threatened miscarriage  Patient's presentation  is most consistent with acute, uncomplicated illness.   Patient is here strictly for med refill.  She does not have any cramping bleeding or concerns for threatened miscarriage.  She was given a refill on her Zofran ODT.  She is to follow-up with OB/GYN.  Return if worsening.      FINAL CLINICAL IMPRESSION(S) / ED DIAGNOSES   Final diagnoses:  Medication refill     Rx / DC Orders   ED Discharge Orders          Ordered    ondansetron (ZOFRAN-ODT) 4 MG disintegrating tablet  Every 6 hours PRN        01/22/22 1024             Note:  This document was prepared using Dragon voice recognition software and may include unintentional dictation errors.    01/24/22, PA-C 01/22/22 1028  Delton Prairie, MD 01/22/22 1344

## 2022-01-22 NOTE — ED Notes (Signed)
See triage note  presents with some n/v states she ran out of her Zofran  denies any other complaints

## 2022-01-22 NOTE — ED Triage Notes (Signed)
Pt via POV from home. Pt states she would like a refill on her Zofran. Pt is pregnant approx 11 weeks. States she has not concern in regards to baby. G4. Pt is A&Ox4 and NAD.

## 2022-01-27 ENCOUNTER — Encounter: Payer: Self-pay | Admitting: Physician Assistant

## 2022-01-27 ENCOUNTER — Ambulatory Visit: Payer: Medicaid Other | Admitting: Physician Assistant

## 2022-01-27 VITALS — BP 116/77 | HR 70 | Temp 98.4°F | Wt 108.8 lb

## 2022-01-27 DIAGNOSIS — Z3481 Encounter for supervision of other normal pregnancy, first trimester: Secondary | ICD-10-CM

## 2022-01-27 DIAGNOSIS — Z3480 Encounter for supervision of other normal pregnancy, unspecified trimester: Secondary | ICD-10-CM | POA: Insufficient documentation

## 2022-01-27 DIAGNOSIS — R200 Anesthesia of skin: Secondary | ICD-10-CM

## 2022-01-27 DIAGNOSIS — O1002 Pre-existing essential hypertension complicating childbirth: Secondary | ICD-10-CM

## 2022-01-27 LAB — URINALYSIS
Bilirubin, UA: NEGATIVE
Glucose, UA: NEGATIVE
Leukocytes,UA: NEGATIVE
Nitrite, UA: NEGATIVE
Protein,UA: NEGATIVE
RBC, UA: NEGATIVE
Specific Gravity, UA: 1.025 (ref 1.005–1.030)
Urobilinogen, Ur: 1 mg/dL (ref 0.2–1.0)
pH, UA: 7 (ref 5.0–7.5)

## 2022-01-27 LAB — HEMOGLOBIN, FINGERSTICK: Hemoglobin: 12.5 g/dL (ref 11.1–15.9)

## 2022-01-27 MED ORDER — ASPIRIN 81 MG PO TBEC: 81.0000 mg | DELAYED_RELEASE_TABLET | Freq: Every day | ORAL | Status: AC

## 2022-01-27 NOTE — Progress Notes (Signed)
Patient here for new OB appointment at about 11 weeks. Had U/S at [facility] on 01/05/22. ED visits 12/31/21, nausea, 01/05/22, flank pain, 01/22/22 refill Zofran, 01/24/22 [facility] for numbness and tingling in feet and fingertips, occurred after Covid vaccines. States she will return to Neurology in July 2023 for follow-up.Marland KitchenMarland KitchenBurt Knack, RN

## 2022-01-27 NOTE — Progress Notes (Signed)
Urine dip and hemoglobin reviewed, no treatment indicated. Patient declines flu vaccine today. States had 2 covid vaccines.Burt Knack, RN

## 2022-01-29 LAB — URINE CULTURE

## 2022-02-01 ENCOUNTER — Encounter: Payer: Self-pay | Admitting: Obstetrics and Gynecology

## 2022-02-01 DIAGNOSIS — Z124 Encounter for screening for malignant neoplasm of cervix: Secondary | ICD-10-CM

## 2022-02-01 DIAGNOSIS — Z3A11 11 weeks gestation of pregnancy: Secondary | ICD-10-CM

## 2022-02-01 DIAGNOSIS — Z3480 Encounter for supervision of other normal pregnancy, unspecified trimester: Secondary | ICD-10-CM

## 2022-02-01 DIAGNOSIS — Z1379 Encounter for other screening for genetic and chromosomal anomalies: Secondary | ICD-10-CM

## 2022-02-02 LAB — MATERNIT 21 PLUS CORE, BLOOD
Fetal Fraction: 9
Result (T21): NEGATIVE
Trisomy 13 (Patau syndrome): NEGATIVE
Trisomy 18 (Edwards syndrome): NEGATIVE
Trisomy 21 (Down syndrome): NEGATIVE

## 2022-02-02 LAB — CBC/D/PLT+RPR+RH+ABO+AB SCR
Antibody Screen: NEGATIVE
Basophils Absolute: 0 10*3/uL (ref 0.0–0.2)
Basos: 1 %
EOS (ABSOLUTE): 0.1 10*3/uL (ref 0.0–0.4)
Eos: 2 %
Hematocrit: 37.8 % (ref 34.0–46.6)
Hemoglobin: 13.1 g/dL (ref 11.1–15.9)
Hepatitis B Surface Ag: NEGATIVE
Immature Grans (Abs): 0 10*3/uL (ref 0.0–0.1)
Immature Granulocytes: 0 %
Lymphocytes Absolute: 1.2 10*3/uL (ref 0.7–3.1)
Lymphs: 23 %
MCH: 32.5 pg (ref 26.6–33.0)
MCHC: 34.7 g/dL (ref 31.5–35.7)
MCV: 94 fL (ref 79–97)
Monocytes Absolute: 0.4 10*3/uL (ref 0.1–0.9)
Monocytes: 7 %
Neutrophils Absolute: 3.7 10*3/uL (ref 1.4–7.0)
Neutrophils: 67 %
Platelets: 256 10*3/uL (ref 150–450)
RBC: 4.03 x10E6/uL (ref 3.77–5.28)
RDW: 12.5 % (ref 11.7–15.4)
RPR Ser Ql: NONREACTIVE
Rh Factor: POSITIVE
WBC: 5.4 10*3/uL (ref 3.4–10.8)

## 2022-02-02 LAB — IGP, APTIMA HPV
HPV Aptima: NEGATIVE
PAP Smear Comment: 0

## 2022-02-02 LAB — AST+BUN+CREAT+LD+URIC A+HGB...
AST: 17 IU/L (ref 0–40)
BUN: 6 mg/dL (ref 6–20)
Creatinine, Ser: 0.59 mg/dL (ref 0.57–1.00)
LDH: 154 IU/L (ref 119–226)
Uric Acid: 3.2 mg/dL (ref 2.6–6.2)
eGFR: 122 mL/min/{1.73_m2} (ref >59)

## 2022-02-02 LAB — HCV INTERPRETATION

## 2022-02-02 LAB — CHLAMYDIA/GC NAA, CONFIRMATION
Chlamydia trachomatis, NAA: NEGATIVE
Neisseria gonorrhoeae, NAA: NEGATIVE

## 2022-02-02 LAB — HIV-1/HIV-2 QUALITATIVE RNA
HIV-1 RNA, Qualitative: NONREACTIVE
HIV-2 RNA, Qualitative: NONREACTIVE

## 2022-02-02 LAB — HCV AB W REFLEX TO QUANT PCR: HCV Ab: NONREACTIVE

## 2022-02-09 ENCOUNTER — Telehealth: Payer: Self-pay | Admitting: Family Medicine

## 2022-02-09 NOTE — Telephone Encounter (Signed)
Return call to patient today as requested.  Left message to please return my call at [phone].  Hart Carwin, RN

## 2022-02-10 ENCOUNTER — Other Ambulatory Visit: Payer: Self-pay | Admitting: Physician Assistant

## 2022-02-10 ENCOUNTER — Telehealth: Payer: Self-pay | Admitting: Physician Assistant

## 2022-02-10 DIAGNOSIS — Z3480 Encounter for supervision of other normal pregnancy, unspecified trimester: Secondary | ICD-10-CM

## 2022-02-10 MED ORDER — ONDANSETRON 4 MG PO TBDP: 4.0000 mg | ORAL_TABLET | Freq: Four times a day (QID) | ORAL | 2 refills | Status: DC | PRN

## 2022-02-10 NOTE — Telephone Encounter (Signed)
Patient is requesting a prescription for Zofran be called in at [facility] on [address].  She reports having the 1 month Medicaid.  She has been getting it through the hospital, but has now had her first appointment with [facility] per patient.  Hart Carwin, RN

## 2022-02-10 NOTE — Telephone Encounter (Signed)
Left message on patient's voice message machine that I called in refill of ondansetron for nausea/vomiting to her pharmacy as requested per 02/09/22 phone call.

## 2022-02-17 NOTE — Addendum Note (Signed)
Addended by: Parthenia Tellefsen on: 02/17/2022 12:21 PM   Modules accepted: Orders  

## 2022-02-24 ENCOUNTER — Telehealth: Payer: Self-pay

## 2022-02-24 ENCOUNTER — Ambulatory Visit: Payer: BC Managed Care – PPO

## 2022-02-24 NOTE — Telephone Encounter (Signed)
Attempt to contact patient for missed appt on 02/24/22 at 0940 for maternity clinic.   Patient did not answer phone but left above message to return call at [phone] to reschedule visit.   Earlyne Iba, RN

## 2022-02-25 NOTE — Telephone Encounter (Signed)
Telephone call to patient to reschedule her [facility] 02-24-22 MH RV.  Left message for patient to call [phone] to reschedule her appointment.  Hart Carwin, RN

## 2022-02-28 NOTE — Telephone Encounter (Signed)
Telephone call to patient today to reschedule her [facility] 02-24-22.  Left message to return the call at [phone] to reschedule her appointment.  Telephone call to her contact/Mother Scarlette Ar) [phone].  Phone states "the caller you are trying to call is temporarily unavailable".  Hart Carwin, RN

## 2022-03-01 NOTE — Telephone Encounter (Signed)
Telephone call to patient today to reschedule her MH RV.  Left message to please call [phone] to reschedule or call to let us know if we need to transfer her care elsewhere. Hart Carwin, RN

## 2022-03-02 NOTE — Telephone Encounter (Signed)
Telephone call to patient today to reschedule her MH RV.  Left message for her to call the appointment line number at [phone] to reschedule her appointment.  Hart Carwin, RN

## 2022-03-03 NOTE — Telephone Encounter (Signed)
Telephone call to patient contact/Mother Carly Stewart) [phone].  Phone states "the caller is temporarily unavailable, try your call later".  Telephone call to patient to reschedule her MH RV.  Left message to please call our office to reschedule at [phone] or to let us know if we can be of assistance to transfer he prenatal care elsewhere.  Hart Carwin, RN

## 2022-03-04 NOTE — Telephone Encounter (Signed)
Attempt to contact patient for missed appt on 02/24/22 at 0940 for maternity clinic.    Patient did not answer phone but left above message to return call at [phone] to reschedule visit.   Contacted emergency contact, mother, Scarlette Ar, however number is unavailable.    Earlyne Iba, RN

## 2022-03-07 NOTE — Telephone Encounter (Signed)
Telephone call to patient contact/Frances Mayford Knife (Mother).  Phone number is unavailable.  Telephone call to patient today as well and mailbox is full.   [facility] New OB 01-27-22 (kept)  I did call a few OB GYN offices to check to see if patient has started her prenatal care elsewhere and this is what I know: No Show appt. 02-01-22 at [facility] no appointment  for prenatal care or hx. [facility] no appointment for prenatal care or hx. Hart Carwin, RN

## 2022-03-08 NOTE — Telephone Encounter (Signed)
Call to client who states has not been making an appt due to money concerns. States has left multiple messages requesting return call from someone at DSS regarding switching her Medicaid to pregnancy Medicaid, but no one has returned her call. Counseled regarding importance of prenatal care and aware can schedule appt without having Medicaid in place. Encouraged to schedule appt and while here to talk with someone from DSS on ground floor regarding how to contact someone in DSS regarding her Medicaid. Client agrees to appt and needs after 1400. Appt scheduled for 03/16/2022 with arrival time of 1420. Jossie Ng, RN

## 2022-03-16 ENCOUNTER — Ambulatory Visit: Payer: Medicaid Other | Admitting: Nurse Practitioner

## 2022-03-16 ENCOUNTER — Encounter: Payer: Self-pay | Admitting: Nurse Practitioner

## 2022-03-16 VITALS — Temp 98.8°F | Wt 125.8 lb

## 2022-03-16 DIAGNOSIS — R202 Paresthesia of skin: Secondary | ICD-10-CM

## 2022-03-16 DIAGNOSIS — Z3480 Encounter for supervision of other normal pregnancy, unspecified trimester: Secondary | ICD-10-CM

## 2022-03-16 DIAGNOSIS — Z3483 Encounter for supervision of other normal pregnancy, third trimester: Secondary | ICD-10-CM | POA: Diagnosis not present

## 2022-03-16 DIAGNOSIS — R200 Anesthesia of skin: Secondary | ICD-10-CM

## 2022-03-16 NOTE — Progress Notes (Signed)
[facility] Department Maternal Health Clinic  PRENATAL VISIT NOTE  Subjective:  Carly Stewart is a 36 y.o. [phone] at [redacted]w[redacted]d being seen today for ongoing prenatal care.  She is currently monitored for the following issues for this high-risk pregnancy and has Benign essential hypertension with delivery; Supervision of other normal pregnancy, antepartum; and Numbness and tingling on their problem list.  Patient reports no complaints.  Contractions: Not present. Vag. Bleeding: None.  Movement: Present. Denies leaking of fluid/ROM.   The following portions of the patient's history were reviewed and updated as appropriate: allergies, current medications, past family history, past medical history, past social history, past surgical history and problem list. Problem list updated.  Objective:   Vitals:   03/16/22 1434  Temp: 98.8 F (37.1 C)  Weight: 125 lb 12.8 oz (57.1 kg)    Fetal Status: Fetal Heart Rate (bpm): 140 Fundal Height: 17 cm Movement: Present     General:  Alert, oriented and cooperative. Patient is in no acute distress.  Skin: Skin is warm and dry. No rash noted.   Cardiovascular: Normal heart rate noted  Respiratory: Normal respiratory effort, no problems with respiration noted  Abdomen: Soft, gravid, appropriate for gestational age.  Pain/Pressure: Absent     Pelvic: Cervical exam deferred        Extremities: Normal range of motion.  Edema: None  Mental Status: Normal mood and affect. Normal behavior. Normal judgment and thought content.   Assessment and Plan:  Pregnancy: [id number] at [redacted]w[redacted]d  1. Supervision of other normal pregnancy, antepartum -36 year old female in clinic today for prenatal care.   -Patient reports she is taking PNV daily. -Agrees to AFP today. -MAT21 normal. -Since last seen on 01/27/22 patient had a 17 lbs weight gain.  Patient states that her nausea and vomiting has subsided and her appetite has increased.  Patient advised to watch  weight gain, limit carbs, sugars, and fats, drink 6-8 bottles of water, and increase activity.   -Patient reports taking ASA.    - AFP, Serum, Open Spina Bifida  2. Numbness and tingling -Patient continues to report numbness and tingling.  Patient had a follow up appointment with [facility] on 03/06/22.  Patient will need additional follow up.  No treatment or medication at this time.   Term labor symptoms and general obstetric precautions including but not limited to vaginal bleeding, contractions, leaking of fluid and fetal movement were reviewed in detail with the patient. Please refer to After Visit Summary for other counseling recommendations.   Return in about 4 weeks (around 04/13/2022) for Routine prenatal care visit.  Future Appointments  Date Time Provider Department Center  04/13/2022  3:20 PM AC-MH PROVIDER AC-MAT None    Glenna Fellows, FNP

## 2022-03-18 LAB — AFP, SERUM, OPEN SPINA BIFIDA
AFP MoM: 1.22
AFP Value: 64.3 ng/mL
Gest. Age on Collection Date: 17.6 weeks
Maternal Age At EDD: 34.3 yr
OSBR Risk 1 IN: 10000
Test Results:: NEGATIVE
Weight: 125 [lb_av]

## 2022-04-13 ENCOUNTER — Ambulatory Visit: Payer: BC Managed Care – PPO | Admitting: Advanced Practice Midwife

## 2022-04-13 VITALS — BP 109/62 | HR 80 | Temp 97.3°F | Wt 137.8 lb

## 2022-04-13 DIAGNOSIS — Z3480 Encounter for supervision of other normal pregnancy, unspecified trimester: Secondary | ICD-10-CM

## 2022-04-13 DIAGNOSIS — R202 Paresthesia of skin: Secondary | ICD-10-CM

## 2022-04-13 DIAGNOSIS — O09292 Supervision of pregnancy with other poor reproductive or obstetric history, second trimester: Secondary | ICD-10-CM | POA: Insufficient documentation

## 2022-04-13 DIAGNOSIS — R200 Anesthesia of skin: Secondary | ICD-10-CM

## 2022-04-13 LAB — URINALYSIS
Bilirubin, UA: NEGATIVE
Glucose, UA: NEGATIVE
Ketones, UA: NEGATIVE
Leukocytes,UA: NEGATIVE
Nitrite, UA: NEGATIVE
Protein,UA: NEGATIVE
RBC, UA: NEGATIVE
Specific Gravity, UA: 1.02 (ref 1.005–1.030)
Urobilinogen, Ur: 1 mg/dL (ref 0.2–1.0)
pH, UA: 6.5 (ref 5.0–7.5)

## 2022-04-13 NOTE — Progress Notes (Signed)
Patient is here for MH RV at 103w5d.   [place] Anat referral faxed to [facility] with confirmation 04/13/22.   Urine dip reviewed during clinic visit.   Earlyne Iba, RN

## 2022-04-13 NOTE — Progress Notes (Signed)
[facility] Department Maternal Health Clinic  PRENATAL VISIT NOTE  Subjective:  Carly Stewart is a 36 y.o. [phone] at [redacted]w[redacted]d being seen today for ongoing prenatal care.  She is currently monitored for the following issues for this high-risk pregnancy and has Benign essential hypertension with delivery; Supervision of other normal pregnancy, antepartum; Numbness and tingling both feet x 1 1/2 years; and Hx of preeclampsia, prior pregnancy 04/30/15 on their problem list.  Patient reports  continued numb feet x 1 1/2 years after covid shot .  Contractions: Not present. Vag. Bleeding: None.  Movement: Present. Denies leaking of fluid/ROM.   The following portions of the patient's history were reviewed and updated as appropriate: allergies, current medications, past family history, past medical history, past social history, past surgical history and problem list. Problem list updated.  Objective:   Vitals:   04/13/22 1518  BP: 109/62  Pulse: 80  Temp: (!) 97.3 F (36.3 C)  Weight: 137 lb 12.8 oz (62.5 kg)    Fetal Status: Fetal Heart Rate (bpm): 135 Fundal Height: 24 cm Movement: Present     General:  Alert, oriented and cooperative. Patient is in no acute distress.  Skin: Skin is warm and dry. No rash noted.   Cardiovascular: Normal heart rate noted  Respiratory: Normal respiratory effort, no problems with respiration noted  Abdomen: Soft, gravid, appropriate for gestational age.  Pain/Pressure: Absent     Pelvic: Cervical exam deferred        Extremities: Normal range of motion.  Edema: None  Mental Status: Normal mood and affect. Normal behavior. Normal judgment and thought content.   Assessment and Plan:  Pregnancy: [id number] at [redacted]w[redacted]d  1. Supervision of other normal pregnancy, antepartum Working 40 hrs/wk Not exercising; encouraged to do so 3x/wk x 20 min Pap 01/27/22 neg HPV neg No UDS ordered at [facility]; baseline u/a and UDS Reviewed 01/05/22 u/s at 7 5/7 with small  subchorionic hemorrhage and EDC=08/19/22 12 lb wt gain in past 4 wks 17 lb 12.8 oz (8.074 kg) NIPS on 01/27/22=neg AFP only 03/16/22=neg Wants BTL Anatomy u/s ordered  - [id number] Drug Screen  2. Hx of preeclampsia, prior pregnancy, currently pregnant, second trimester Preeclampsia with 04/30/15 labor with IV Labetalol x 2 in labor, 24 hours of MgSo4, and d/c home with Procardia 30 mg XL. Delivering provider suspected CHTN Taking ASA 81 mg daily  - Urinalysis (Urine Dip)  3. Numbness and tingling both feet Since 1 week after second covid shot (Moderna) 1 1/2 years ago. Also had numb hands but that resolved after 6 mo. Feet numbness constantly with some calf tingling occasionally. Had [facility] apt 01/24/22 and [facility] nerve conduction studies but [facility] Encouraged pt to reschedule so they can r/o MS   Preterm labor symptoms and general obstetric precautions including but not limited to vaginal bleeding, contractions, leaking of fluid and fetal movement were reviewed in detail with the patient. Please refer to After Visit Summary for other counseling recommendations.  No follow-ups on file.  No future appointments.  Alberteen Spindle, CNM

## 2022-04-14 LAB — 789231 7+OXYCODONE-BUND
Amphetamines, Urine: NEGATIVE ng/mL
BENZODIAZ UR QL: NEGATIVE ng/mL
Barbiturate screen, urine: NEGATIVE ng/mL
Cannabinoid Quant, Ur: NEGATIVE ng/mL
Cocaine (Metab.): NEGATIVE ng/mL
OPIATE SCREEN URINE: NEGATIVE ng/mL
Oxycodone/Oxymorphone, Urine: NEGATIVE ng/mL
PCP Quant, Ur: NEGATIVE ng/mL

## 2022-04-15 ENCOUNTER — Telehealth: Payer: Self-pay

## 2022-04-15 NOTE — Telephone Encounter (Signed)
Call to [facility] to ascertain if anatomy US has been scheduled. Per Lawanna Kobus, has [place] appt 04/25/2022 at 1415. Jossie Ng, RN

## 2022-04-29 ENCOUNTER — Encounter: Payer: Self-pay | Admitting: Advanced Practice Midwife

## 2022-05-11 ENCOUNTER — Ambulatory Visit: Payer: BC Managed Care – PPO | Admitting: Advanced Practice Midwife

## 2022-05-11 VITALS — BP 116/73 | HR 75 | Temp 96.8°F | Wt 148.0 lb

## 2022-05-11 DIAGNOSIS — R200 Anesthesia of skin: Secondary | ICD-10-CM

## 2022-05-11 DIAGNOSIS — Z3482 Encounter for supervision of other normal pregnancy, second trimester: Secondary | ICD-10-CM

## 2022-05-11 DIAGNOSIS — Z3480 Encounter for supervision of other normal pregnancy, unspecified trimester: Secondary | ICD-10-CM

## 2022-05-11 DIAGNOSIS — O09292 Supervision of pregnancy with other poor reproductive or obstetric history, second trimester: Secondary | ICD-10-CM

## 2022-05-11 DIAGNOSIS — R202 Paresthesia of skin: Secondary | ICD-10-CM

## 2022-05-11 MED ORDER — PRENATAL VITAMINS 28-0.8 MG PO TABS: 1.0000 | ORAL_TABLET | Freq: Every day | ORAL | 0 refills | Status: AC

## 2022-05-11 NOTE — Progress Notes (Signed)
Declined flu vaccine. Copy of signed BTL Form given.

## 2022-05-11 NOTE — Progress Notes (Signed)
[place] Department Maternal Health Clinic  PRENATAL VISIT NOTE  Subjective:  Carly Stewart is a 36 y.o. [phone] at [redacted]w[redacted]d being seen today for ongoing prenatal care.  She is currently monitored for the following issues for this high-risk pregnancy and has Benign essential hypertension with delivery; Supervision of other normal pregnancy, antepartum; Numbness and tingling both feet x 1 1/2 years; and Hx of preeclampsia, prior pregnancy 04/30/15 on their problem list.  Patient reports no complaints.  Contractions: Not present. Vag. Bleeding: None.  Movement: Present. Denies leaking of fluid/ROM.   The following portions of the patient's history were reviewed and updated as appropriate: allergies, current medications, past family history, past medical history, past social history, past surgical history and problem list. Problem list updated.  Objective:   Vitals:   05/11/22 1454  BP: 116/73  Pulse: 75  Temp: (!) 96.8 F (36 C)  Weight: 148 lb (67.1 kg)    Fetal Status: Fetal Heart Rate (bpm): 140 Fundal Height: 31 cm Movement: Present     General:  Alert, oriented and cooperative. Patient is in no acute distress.  Skin: Skin is warm and dry. No rash noted.   Cardiovascular: Normal heart rate noted  Respiratory: Normal respiratory effort, no problems with respiration noted  Abdomen: Soft, gravid, appropriate for gestational age.  Pain/Pressure: Absent     Pelvic: Cervical exam deferred        Extremities: Normal range of motion.  Edema: None  Mental Status: Normal mood and affect. Normal behavior. Normal judgment and thought content.   Assessment and Plan:  Pregnancy: [id number] at [redacted]w[redacted]d  1. Supervision of other normal pregnancy, antepartum 28 lb (12.7 kg) 11 lb wt gain in past 4 wks Walking 3x/wk x 15 min Just came back from a cruise yesterday Working 40 hrs/wk Signed BTL papers today Reviewed anatomy u/s on 04/25/22 at 24.0 with posterior placenta, AFI wnl,  normal anatomy  - Prenatal Vit-Fe Fumarate-FA (PRENATAL VITAMINS) 28-0.8 MG TABS; Take 1 tablet by mouth daily.  Dispense: 100 tablet; Refill: 0  2. Hx of preeclampsia, prior pregnancy 04/30/15 116/73 today  3. Numbness and tingling both feet x 1 1/2 years [facility] [place] neuro apt 03/06/22 Pt still has not called to reschedule apt but promises to do so  - Prenatal Vit-Fe Fumarate-FA (PRENATAL VITAMINS) 28-0.8 MG TABS; Take 1 tablet by mouth daily.  Dispense: 100 tablet; Refill: 0   Preterm labor symptoms and general obstetric precautions including but not limited to vaginal bleeding, contractions, leaking of fluid and fetal movement were reviewed in detail with the patient. Please refer to After Visit Summary for other counseling recommendations.  Return in about 2 weeks (around 05/25/2022) for 28 week labs, routine PNC.  No future appointments.  Herbie Saxon, CNM

## 2022-05-25 ENCOUNTER — Ambulatory Visit: Payer: BC Managed Care – PPO | Admitting: Advanced Practice Midwife

## 2022-05-25 VITALS — BP 114/72 | HR 71 | Temp 98.3°F | Wt 152.6 lb

## 2022-05-25 DIAGNOSIS — Z23 Encounter for immunization: Secondary | ICD-10-CM | POA: Diagnosis not present

## 2022-05-25 DIAGNOSIS — O09292 Supervision of pregnancy with other poor reproductive or obstetric history, second trimester: Secondary | ICD-10-CM

## 2022-05-25 DIAGNOSIS — R200 Anesthesia of skin: Secondary | ICD-10-CM

## 2022-05-25 DIAGNOSIS — Z3480 Encounter for supervision of other normal pregnancy, unspecified trimester: Secondary | ICD-10-CM

## 2022-05-25 DIAGNOSIS — R202 Paresthesia of skin: Secondary | ICD-10-CM

## 2022-05-25 DIAGNOSIS — Z3483 Encounter for supervision of other normal pregnancy, third trimester: Secondary | ICD-10-CM

## 2022-05-25 LAB — HEMOGLOBIN, FINGERSTICK: Hemoglobin: 11.8 g/dL (ref 11.1–15.9)

## 2022-05-25 NOTE — Progress Notes (Signed)
In House Lab WNL. BThiele RN

## 2022-05-25 NOTE — Progress Notes (Signed)
[place] Department Maternal Health Clinic  PRENATAL VISIT NOTE  Subjective:  Carly Stewart is a 36 y.o. [phone] at [redacted]w[redacted]d being seen today for ongoing prenatal care.  She is currently monitored for the following issues for this low-risk pregnancy and has Benign essential hypertension with delivery; Supervision of other normal pregnancy, antepartum; Numbness and tingling both feet x 1 1/2 years; and Hx of preeclampsia, prior pregnancy 04/30/15 on their problem list.  Patient reports heartburn.  Contractions: Not present. Vag. Bleeding: None.  Movement: Present. Denies leaking of fluid/ROM.   The following portions of the patient's history were reviewed and updated as appropriate: allergies, current medications, past family history, past medical history, past social history, past surgical history and problem list. Problem list updated.  Objective:   Vitals:   05/25/22 1500  BP: 114/72  Pulse: 71  Temp: 98.3 F (36.8 C)  Weight: 152 lb 9.6 oz (69.2 kg)    Fetal Status: Fetal Heart Rate (bpm): 135 Fundal Height: 30 cm Movement: Present     General:  Alert, oriented and cooperative. Patient is in no acute distress.  Skin: Skin is warm and dry. No rash noted.   Cardiovascular: Normal heart rate noted  Respiratory: Normal respiratory effort, no problems with respiration noted  Abdomen: Soft, gravid, appropriate for gestational age.  Pain/Pressure: Absent     Pelvic: Cervical exam deferred        Extremities: Normal range of motion.  Edema: None  Mental Status: Normal mood and affect. Normal behavior. Normal judgment and thought content.   Assessment and Plan:  Pregnancy: [id number] at [redacted]w[redacted]d  1. Supervision of other normal pregnancy, antepartum C/o GERD--suggestions given 32 lb 9.6 oz (14.8 kg) 4 lb wt gain in past 4 wks Has decreased eating at [name] 1 hour glucola today Working 40 hrs/wk Walking 3-4x/wk x 20 min Reviewed 04/25/22 u/s at 24.0 wks with posterior  placenta, AFI wnl, anatomy wnl U/s ordered for S>D Signed BTL papers 05/11/22 No car seat or crib yet; awaiting baby shower 07/2022 R. Karrie Doffing, LCSW in to see pt  - Glucose Tolerance, 1 Hour - RPR - HIV-1/HIV-2 Qualitative RNA - Hemoglobin, venipuncture  2. Numbness and tingling both feet x 1 1/2 years [facility] [place] neuro apt 03/06/22 Pt rescheduled for 06/19/22  3. Hx of preeclampsia, prior pregnancy 04/30/15 114/72 today   Preterm labor symptoms and general obstetric precautions including but not limited to vaginal bleeding, contractions, leaking of fluid and fetal movement were reviewed in detail with the patient. Please refer to After Visit Summary for other counseling recommendations.  Return in about 2 weeks (around 06/08/2022) for routine PNC.  Future Appointments  Date Time Provider [place]  06/08/2022  3:20 PM AC-MH PROVIDER AC-MAT None    Herbie Saxon, CNM

## 2022-05-25 NOTE — Progress Notes (Signed)
[place] referral for size > dates faxed to [facility] with fax confirmation received. Rich Number, RN HGB WNL. BThiele RN

## 2022-05-27 ENCOUNTER — Telehealth: Payer: Self-pay

## 2022-05-27 DIAGNOSIS — R7309 Other abnormal glucose: Secondary | ICD-10-CM | POA: Insufficient documentation

## 2022-05-27 LAB — HIV-1/HIV-2 QUALITATIVE RNA
HIV-1 RNA, Qualitative: NONREACTIVE
HIV-2 RNA, Qualitative: NONREACTIVE

## 2022-05-27 LAB — GLUCOSE TOLERANCE, 1 HOUR: Glucose, 1Hr PP: 163 mg/dL (ref 70–199)

## 2022-05-27 LAB — RPR: RPR Ser Ql: NONREACTIVE

## 2022-05-27 NOTE — Telephone Encounter (Signed)
TC to patient to inform of elevated 1 hour gtt (163), and need for 3 hour gtt. Patient scheduled for 3 hour gtt on 06/02/22. Counseled on fasting protocol and states understanding. Jenetta Downer, RN

## 2022-05-30 ENCOUNTER — Telehealth: Payer: Self-pay

## 2022-05-30 NOTE — Telephone Encounter (Signed)
Call to [facility] to ascertain if [place] for S > D scheduled. Per Margreta Journey, client has [place] appt scheduled for 06/09/2022 at 1615 and is aware of appt. Rich Number, RN

## 2022-06-02 ENCOUNTER — Other Ambulatory Visit: Payer: BC Managed Care – PPO

## 2022-06-02 ENCOUNTER — Telehealth: Payer: Self-pay

## 2022-06-02 DIAGNOSIS — R7309 Other abnormal glucose: Secondary | ICD-10-CM

## 2022-06-02 NOTE — Progress Notes (Addendum)
For 3 hour GTT.  1 hour glucose tolerance was 163.  28 wks 6 days gestation today.  NPO since 10 pm.  Instructions given.   MHC RV appt 06/08/22; pt has reminder.    Tonny Branch, RN

## 2022-06-02 NOTE — Telephone Encounter (Signed)
Encounter opened in error. Maejor Erven, RN  

## 2022-06-03 LAB — GESTATIONAL GLUCOSE TOLERANCE
Glucose, Fasting: 79 mg/dL (ref 70–94)
Glucose, GTT - 1 Hour: 89 mg/dL (ref 70–179)
Glucose, GTT - 2 Hour: 93 mg/dL (ref 70–154)
Glucose, GTT - 3 Hour: 93 mg/dL (ref 70–139)

## 2022-06-08 ENCOUNTER — Ambulatory Visit: Payer: BC Managed Care – PPO | Admitting: Advanced Practice Midwife

## 2022-06-08 VITALS — BP 123/72 | HR 85 | Temp 98.1°F | Wt 153.8 lb

## 2022-06-08 DIAGNOSIS — R202 Paresthesia of skin: Secondary | ICD-10-CM

## 2022-06-08 DIAGNOSIS — R200 Anesthesia of skin: Secondary | ICD-10-CM

## 2022-06-08 DIAGNOSIS — O09292 Supervision of pregnancy with other poor reproductive or obstetric history, second trimester: Secondary | ICD-10-CM

## 2022-06-08 DIAGNOSIS — Z3480 Encounter for supervision of other normal pregnancy, unspecified trimester: Secondary | ICD-10-CM

## 2022-06-08 DIAGNOSIS — R7309 Other abnormal glucose: Secondary | ICD-10-CM

## 2022-06-08 NOTE — Progress Notes (Addendum)
[place] Department Maternal Health Clinic  PRENATAL VISIT NOTE  Subjective:  Carly Stewart is a 36 y.o. [phone] at [redacted]w[redacted]d being seen today for ongoing prenatal care.  She is currently monitored for the following issues for this low-risk pregnancy and has Benign essential hypertension with delivery; Supervision of other normal pregnancy, antepartum; Numbness and tingling both feet x 1 1/2 years; Hx of preeclampsia, prior pregnancy 04/30/15; and Elevated glucose on their problem list.  Patient reports no complaints.  Contractions: Not present. Vag. Bleeding: None.  Movement: Present. Denies leaking of fluid/ROM.   The following portions of the patient's history were reviewed and updated as appropriate: allergies, current medications, past family history, past medical history, past social history, past surgical history and problem list. Problem list updated.  Objective:   Vitals:   06/08/22 1538  BP: 123/72  Pulse: 85  Temp: 98.1 F (36.7 C)  Weight: 153 lb 12.8 oz (69.8 kg)    Fetal Status: Fetal Heart Rate (bpm): 145 Fundal Height: 32 cm Movement: Present     General:  Alert, oriented and cooperative. Patient is in no acute distress.  Skin: Skin is warm and dry. No rash noted.   Cardiovascular: Normal heart rate noted  Respiratory: Normal respiratory effort, no problems with respiration noted  Abdomen: Soft, gravid, appropriate for gestational age.  Pain/Pressure: Absent     Pelvic: Cervical exam deferred        Extremities: Normal range of motion.  Edema: None  Mental Status: Normal mood and affect. Normal behavior. Normal judgment and thought content.   Assessment and Plan:  Pregnancy: [id number] at [redacted]w[redacted]d  1. Supervision of other normal pregnancy, antepartum 33 lb 12.8 oz (15.3 kg) Working 40 hrs/wk Walking 3-4x/wk x 20 min [place] u/s for S>D reviewed 05/31/22 at 29 3/7 with posterior placenta, AFI wnl, EFW=65%, vtx Baby shower 06/26/22  2. Numbness and tingling  both feet x 1 1/2 years [facility] apt 03/06/22 for f/u Plans to keep [facility] neuro f/u 06/19/22  3. Hx of preeclampsia, prior pregnancy 04/30/15 123/72 today  4. Elevated glucose 3 hour GTT 06/02/22=wnl   Preterm labor symptoms and general obstetric precautions including but not limited to vaginal bleeding, contractions, leaking of fluid and fetal movement were reviewed in detail with the patient. Please refer to After Visit Summary for other counseling recommendations.  Return in about 2 weeks (around 06/22/2022) for routine PNC.  Future Appointments  Date Time Provider [place]  06/22/2022  3:30 PM AC-MH PROVIDER AC-MAT None    Herbie Saxon, CNM

## 2022-06-09 NOTE — Addendum Note (Signed)
Addended by: Cinthia Rodden on: 06/09/2022 12:10 PM   Modules accepted: Orders  

## 2022-06-22 ENCOUNTER — Ambulatory Visit: Payer: BC Managed Care – PPO | Admitting: Advanced Practice Midwife

## 2022-06-22 VITALS — BP 121/74 | HR 71 | Temp 98.5°F | Wt 155.0 lb

## 2022-06-22 DIAGNOSIS — R7309 Other abnormal glucose: Secondary | ICD-10-CM

## 2022-06-22 DIAGNOSIS — R200 Anesthesia of skin: Secondary | ICD-10-CM

## 2022-06-22 DIAGNOSIS — Z3480 Encounter for supervision of other normal pregnancy, unspecified trimester: Secondary | ICD-10-CM

## 2022-06-22 DIAGNOSIS — O09292 Supervision of pregnancy with other poor reproductive or obstetric history, second trimester: Secondary | ICD-10-CM

## 2022-06-22 DIAGNOSIS — Z3483 Encounter for supervision of other normal pregnancy, third trimester: Secondary | ICD-10-CM

## 2022-06-22 DIAGNOSIS — O09293 Supervision of pregnancy with other poor reproductive or obstetric history, third trimester: Secondary | ICD-10-CM

## 2022-06-22 DIAGNOSIS — R202 Paresthesia of skin: Secondary | ICD-10-CM

## 2022-06-22 NOTE — Progress Notes (Addendum)
Client went to 06/19/2022 [facility] neurology clinic appt in pm, but appt was in am. Per client, appt rescheduled fro 07/05/2022 at 1500. Jossie Ng, RN R. Marlan Palau MSW, [facility] Care Manager in room with client. Jossie Ng, RN

## 2022-06-22 NOTE — Progress Notes (Signed)
[facility] Health Department Maternal Health Clinic  PRENATAL VISIT NOTE  Subjective:  Carly Stewart is a 36 y.o. [phone] at [redacted]w[redacted]d being seen today for ongoing prenatal care.  She is currently monitored for the following issues for this low-risk pregnancy and has Benign essential hypertension with delivery; Supervision of other normal pregnancy, antepartum; Numbness and tingling both feet x 1 1/2 years; Hx of preeclampsia, prior pregnancy 04/30/15; and Elevated glucose on their problem list.  Patient reports no complaints.  Contractions: Not present. Vag. Bleeding: None.  Movement: Present. Denies leaking of fluid/ROM.   The following portions of the patient's history were reviewed and updated as appropriate: allergies, current medications, past family history, past medical history, past social history, past surgical history and problem list. Problem list updated.  Objective:   Vitals:   06/22/22 1551  BP: 121/74  Pulse: 71  Temp: 98.5 F (36.9 C)  Weight: 155 lb (70.3 kg)    Fetal Status: Fetal Heart Rate (bpm): 150 Fundal Height: 34 cm Movement: Present     General:  Alert, oriented and cooperative. Patient is in no acute distress.  Skin: Skin is warm and dry. No rash noted.   Cardiovascular: Normal heart rate noted  Respiratory: Normal respiratory effort, no problems with respiration noted  Abdomen: Soft, gravid, appropriate for gestational age.  Pain/Pressure: Absent     Pelvic: Cervical exam deferred        Extremities: Normal range of motion.  Edema: None  Mental Status: Normal mood and affect. Normal behavior. Normal judgment and thought content.   Assessment and Plan:  Pregnancy: [id number] at [redacted]w[redacted]d  1. Numbness and tingling both feet x 1 1/2 years [facility] 03/06/22 apt and 06/19/22 apt Rescheduled to 07/05/22  2. Hx of preeclampsia, prior pregnancy 04/30/15 121/74 today  3. Supervision of other normal pregnancy, antepartum 35 lb (15.9 kg) Baby shower on  06/26/22 Working 40 hrs/wk Walking 3-4x/wk x 20 min   4. Elevated glucose 3 hour GTT wnl on 06/02/22    Preterm labor symptoms and general obstetric precautions including but not limited to vaginal bleeding, contractions, leaking of fluid and fetal movement were reviewed in detail with the patient. Please refer to After Visit Summary for other counseling recommendations.  No follow-ups on file.  Future Appointments  Date Time Provider Department Center  07/07/2022  2:40 PM AC-MH PROVIDER AC-MAT None    Alberteen Spindle, CNM

## 2022-07-07 ENCOUNTER — Ambulatory Visit: Payer: BC Managed Care – PPO | Admitting: Physician Assistant

## 2022-07-07 ENCOUNTER — Encounter: Payer: Self-pay | Admitting: Physician Assistant

## 2022-07-07 VITALS — BP 124/71 | HR 73 | Temp 97.2°F | Wt 162.2 lb

## 2022-07-07 DIAGNOSIS — Z3483 Encounter for supervision of other normal pregnancy, third trimester: Secondary | ICD-10-CM

## 2022-07-07 DIAGNOSIS — Z3480 Encounter for supervision of other normal pregnancy, unspecified trimester: Secondary | ICD-10-CM

## 2022-07-07 DIAGNOSIS — R202 Paresthesia of skin: Secondary | ICD-10-CM

## 2022-07-07 DIAGNOSIS — O09292 Supervision of pregnancy with other poor reproductive or obstetric history, second trimester: Secondary | ICD-10-CM

## 2022-07-07 DIAGNOSIS — R200 Anesthesia of skin: Secondary | ICD-10-CM

## 2022-07-07 DIAGNOSIS — O09293 Supervision of pregnancy with other poor reproductive or obstetric history, third trimester: Secondary | ICD-10-CM

## 2022-07-07 NOTE — Progress Notes (Signed)
[facility] Health Department Maternal Health Clinic  PRENATAL VISIT NOTE  Subjective:  Carly Stewart is a 36 y.o. [phone] at [redacted]w[redacted]d being seen today for ongoing prenatal care.  She is currently monitored for the following issues for this high-risk pregnancy and has Benign essential hypertension with delivery; Supervision of other normal pregnancy, antepartum; Numbness and tingling both feet x 1 1/2 years; Hx of preeclampsia, prior pregnancy 04/30/15; and Elevated glucose on their problem list.  Patient reports no complaints.  Contractions: Not present. Vag. Bleeding: None.  Movement: Present. Denies leaking of fluid/ROM.   The following portions of the patient's history were reviewed and updated as appropriate: allergies, current medications, past family history, past medical history, past social history, past surgical history and problem list. Problem list updated.  Objective:   Vitals:   07/07/22 1434  BP: 124/71  Pulse: 73  Temp: (!) 97.2 F (36.2 C)  Weight: 162 lb 3.2 oz (73.6 kg)    Fetal Status: Fetal Heart Rate (bpm): 132 Fundal Height: 35 cm Movement: Present     General:  Alert, oriented and cooperative. Patient is in no acute distress.  Skin: Skin is warm and dry. No rash noted.   Cardiovascular: Normal heart rate noted  Respiratory: Normal respiratory effort, no problems with respiration noted  Abdomen: Soft, gravid, appropriate for gestational age.  Pain/Pressure: Absent     Pelvic: Cervical exam deferred        Extremities: Normal range of motion.  Edema: Mild pitting, slight indentation  Mental Status: Normal mood and affect. Normal behavior. Normal judgment and thought content.   Assessment and Plan:  Pregnancy: [id number] at [redacted]w[redacted]d  1. Supervision of other normal pregnancy, antepartum Continue current care.  2. Hx of preeclampsia, prior pregnancy 04/30/15 Continue to monitor for signs and symptoms. Note 1+ pedal edema today - just finished 8 hour work shift,  and BP is normal.  3. Numbness and tingling both feet x 1 1/2 years Pt prefers to wait until she has insurance coverage for further eval. Sx have not changed.  Preterm labor symptoms and general obstetric precautions including but not limited to vaginal bleeding, contractions, leaking of fluid and fetal movement were reviewed in detail with the patient. Please refer to After Visit Summary for other counseling recommendations.  Return in about 2 weeks (around 07/21/2022) for Routine prenatal care.  No future appointments.  Landry Dyke, PA-C

## 2022-07-08 ENCOUNTER — Telehealth: Payer: Self-pay

## 2022-07-08 NOTE — Telephone Encounter (Signed)
Call to patient - no answer and LVM.   Message to patient that she was seen in clinic on 07/07/22 and she did not make an appointment to return to clinic. Patient due to return in 2 weeks around 07/22/22. To call [phone] to schedule an appointment.   Earlyne Iba, RN

## 2022-07-11 NOTE — Telephone Encounter (Signed)
TC to patient to schedule next MH  RV around 07/21/22. LM with number to call.Burt Knack, RN

## 2022-07-12 NOTE — Telephone Encounter (Signed)
Return call from client to schedule [facility] RV appt which is needed ~ 07/21/2022. Per client, due to work schedule can only come after 2 pm. Client scheduled to arrive at 3:40 pm 07/21/2022. Client aware she will be worked in. Jossie Ng, RN

## 2022-07-12 NOTE — Telephone Encounter (Signed)
Call to client as needs [facility] RV appt ~ 07/22/2022. Left message to call with number to call provided. As client has not yet responded to phone messages, call made to emergency contact, but number unavailable. Jossie Ng, RN

## 2022-07-15 ENCOUNTER — Other Ambulatory Visit: Payer: Self-pay

## 2022-07-15 ENCOUNTER — Emergency Department
Admission: EM | Admit: 2022-07-15 | Discharge: 2022-07-15 | Disposition: A | Discharge status: Home / Self Care | Payer: BLUE CROSS/BLUE SHIELD | Attending: Emergency Medicine | Admitting: Emergency Medicine

## 2022-07-15 ENCOUNTER — Encounter: Payer: Self-pay | Admitting: Emergency Medicine

## 2022-07-15 DIAGNOSIS — R609 Edema, unspecified: Secondary | ICD-10-CM | POA: Insufficient documentation

## 2022-07-15 DIAGNOSIS — R0981 Nasal congestion: Secondary | ICD-10-CM | POA: Insufficient documentation

## 2022-07-15 DIAGNOSIS — Z1152 Encounter for screening for COVID-19: Secondary | ICD-10-CM | POA: Insufficient documentation

## 2022-07-15 LAB — GROUP A STREP BY PCR: Group A Strep by PCR: NOT DETECTED

## 2022-07-15 LAB — RESP PANEL BY RT-PCR (FLU A&B, COVID) ARPGX2
Influenza A by PCR: NEGATIVE
Influenza B by PCR: NEGATIVE
SARS Coronavirus 2 by RT PCR: NEGATIVE

## 2022-07-15 MED ORDER — MEDICAL COMPRESSION STOCKINGS MISC: 0 refills | Status: DC

## 2022-07-15 NOTE — Discharge Instructions (Signed)
As we discussed please use Tylenol for discomfort and you may also use over-the-counter pseudoephedrine for nasal congestion.  Please follow-up with your OB/GYN within the next several days for recheck/reevaluation.  You have been prescribed compression stockings to help with your lower extremity edema.  Please wear these during the day while you are up you may take them off at night or to shower, etc.  Return to the emergency department for any trouble breathing, or any other symptom personally concerning to yourself.

## 2022-07-15 NOTE — ED Triage Notes (Addendum)
Pt reports that she is 35 weeks preg and has been sick (congested) for about a month but now has a cough that will not go away and sore throat. Pt reports swelling to BLE but denies any abd pain/vag bleeding/fluid leaking. Pt reports fetal movements are good.

## 2022-07-15 NOTE — ED Provider Notes (Signed)
[facility] Provider Note    Event Date/Time   First MD Initiated Contact with Patient 07/15/22 1255     (approximate)  History   Chief Complaint: Cough  HPI  Carly Stewart is a 36 y.o. female approximately [redacted] weeks pregnant who presents to the emergency department for nasal congestion slight cough as well as lower extremity edema.  According to the patient over the past several months she has been experiencing lower extreme edema that feels like it is worsening.  She also states for the past 3 to 4 weeks she has had a lot of nasal congestion and a slight cough at times.  Denies any fever.  No chest pain.  No shortness of breath.  Patient follows up with the health department for her OB care.  Denies any abdominal pain, cramping, vaginal bleeding discharge or fluid leakage.  Physical Exam   Triage Vital Signs: ED Triage Vitals  Enc Vitals Group     BP 07/15/22 1029 (!) 146/77     Pulse Rate 07/15/22 1029 81     Resp 07/15/22 1029 18     Temp 07/15/22 1029 98.2 F (36.8 C)     Temp Source 07/15/22 1029 Oral     SpO2 --      Weight 07/15/22 1030 162 lb (73.5 kg)     Height 07/15/22 1030 5\' 4"  (1.626 m)     Head Circumference --      Peak Flow --      Pain Score 07/15/22 1030 0     Pain Loc --      Pain Edu? --      Excl. in GC? --     Most recent vital signs: Vitals:   07/15/22 1029  BP: (!) 146/77  Pulse: 81  Resp: 18  Temp: 98.2 F (36.8 C)    General: Awake, no distress.  Moderate nasal congestion. CV:  Good peripheral perfusion.  Regular rate and rhythm  Resp:  Normal effort.  Equal breath sounds bilaterally.  No wheeze rales or rhonchi. Abd:  No distention.  Soft, nontender.  No rebound or guarding. Other:  1+ lower extreme edema bilaterally.   ED Results / Procedures / Treatments    MEDICATIONS ORDERED IN ED: Medications - No data to display   IMPRESSION / MDM / ASSESSMENT AND PLAN / ED COURSE  I reviewed the triage vital  signs and the nursing notes.  Patient's presentation is most consistent with acute presentation with potential threat to life or bodily function.  Patient presents emergency department for continued nasal congestion and slight cough over the last several weeks as well as lower extreme edema worsening over the last several months.  Patient does have 1+ pitting edema to bilateral lower extremities, patient is [redacted] weeks pregnant I suspect the edema is likely due to to the pregnancy.  Discussed with the patient use of compression stockings and following up with her OB for recheck.  Patient strep test COVID and influenza are negative.  Discussed with the patient use of over-the-counter pseudoephedrine for congestion as well as Tylenol if needed for discomfort.  Patient agreeable to plan of care and will follow-up with her OB.  FINAL CLINICAL IMPRESSION(S) / ED DIAGNOSES   Nasal congestion Peripheral edema  Rx / DC Orders   Compression stockings, supportive care, over-the-counter pseudoephedrine  Note:  This document was prepared using Dragon voice recognition software and may include unintentional dictation errors.   14/08/23, MD  07/15/22 1303  

## 2022-07-19 ENCOUNTER — Ambulatory Visit: Payer: BC Managed Care – PPO

## 2022-07-19 ENCOUNTER — Ambulatory Visit: Payer: BC Managed Care – PPO | Admitting: Nurse Practitioner

## 2022-07-19 VITALS — BP 135/84 | HR 80 | Temp 97.9°F | Wt 169.8 lb

## 2022-07-19 DIAGNOSIS — O09292 Supervision of pregnancy with other poor reproductive or obstetric history, second trimester: Secondary | ICD-10-CM

## 2022-07-19 DIAGNOSIS — Z3483 Encounter for supervision of other normal pregnancy, third trimester: Secondary | ICD-10-CM

## 2022-07-19 DIAGNOSIS — Z3480 Encounter for supervision of other normal pregnancy, unspecified trimester: Secondary | ICD-10-CM

## 2022-07-19 DIAGNOSIS — O09293 Supervision of pregnancy with other poor reproductive or obstetric history, third trimester: Secondary | ICD-10-CM

## 2022-07-19 LAB — URINALYSIS
Bilirubin, UA: NEGATIVE
Glucose, UA: NEGATIVE
Ketones, UA: NEGATIVE
Leukocytes,UA: NEGATIVE
Nitrite, UA: NEGATIVE
Protein,UA: NEGATIVE
RBC, UA: NEGATIVE
Specific Gravity, UA: 1.02 (ref 1.005–1.030)
Urobilinogen, Ur: 1 mg/dL (ref 0.2–1.0)
pH, UA: 6.5 (ref 5.0–7.5)

## 2022-07-20 ENCOUNTER — Encounter: Payer: Self-pay | Admitting: Nurse Practitioner

## 2022-07-20 LAB — PROTEIN / CREATININE RATIO, URINE
Creatinine, Urine: 118.2 mg/dL
Protein, Ur: 14.2 mg/dL
Protein/Creat Ratio: 120 mg/g creat (ref 0–200)

## 2022-07-20 NOTE — Progress Notes (Unsigned)
[facility]  PRENATAL VISIT NOTE  Subjective:  Carly Stewart is a 36 y.o. [phone] at [redacted]w[redacted]d being seen today for ongoing prenatal care.  She is currently monitored for the following issues for this high-risk pregnancy and has Benign essential hypertension with delivery; Supervision of other normal pregnancy, antepartum; Numbness and tingling both feet x 1 1/2 years; Hx of preeclampsia, prior pregnancy 04/30/15; and Elevated glucose on their problem list.  Patient reports  swelling in lower extremities .  Contractions: Irritability. Vag. Bleeding: None.  Movement: Present. Denies leaking of fluid/ROM.   The following portions of the patient's history were reviewed and updated as appropriate: allergies, current medications, past family history, past medical history, past social history, past surgical history and problem list. Problem list updated.  Objective:   Vitals:   07/19/22 1311  BP: 135/84  Pulse: 80  Temp: 97.9 F (36.6 C)  Weight: 169 lb 12.8 oz (77 kg)    Fetal Status: Fetal Heart Rate (bpm): 140 Fundal Height: 35 cm Movement: Present     General:  Alert, oriented and cooperative. Patient is in no acute distress.  Skin: Skin is warm and dry. No rash noted.   Cardiovascular: Normal heart rate noted  Respiratory: Normal respiratory effort, no problems with respiration noted  Abdomen: Soft, gravid, appropriate for gestational age.  Pain/Pressure: Absent     Pelvic: Cervical exam deferred        Extremities: Normal range of motion.  Edema: Moderate pitting, indentation subsides rapidly  Mental Status: Normal mood and affect. Normal behavior. Normal judgment and thought content.   Assessment and Plan:  Pregnancy: [id number] at [redacted]w[redacted]d  1. Supervision of other normal pregnancy, antepartum -36 year old female in clinic today for a prenatal care problem.  Patient reports that for the last couple weeks she has been experiencing some  swelling in her lower extremities.  Patient states that she reported to the hospital on 07/22/22 and recommended patient to elevate extremities and use supportive stockings.   Denies headaches, floaters, and epigastric pain.  2+ pitting edema noted on physical exam.   2. Hx of preeclampsia, prior pregnancy 04/30/15 --BP today 135/84, denies signs and symptoms of preeclampsia.  Urine dip obtained today, negative.  Will await spot protein results.   Patient to return for [facility] appointment and in one week.   - Urinalysis (Urine Dip) - Protein / creatinine ratio, urine  (Spot)   Term labor symptoms and general obstetric precautions including but not limited to vaginal bleeding, contractions, leaking of fluid and fetal movement were reviewed in detail with the patient. Please refer to After Visit Summary for other counseling recommendations.   Return in about 2 days (around 07/21/2022) for Routine prenatal care visit.  Future Appointments  Date Time Provider Department Center  07/21/2022  4:10 PM [facility] PROVIDER [facility] None    Glenna Fellows, FNP

## 2022-07-21 ENCOUNTER — Encounter: Payer: Self-pay | Admitting: Physician Assistant

## 2022-07-21 ENCOUNTER — Ambulatory Visit: Payer: BC Managed Care – PPO | Admitting: Physician Assistant

## 2022-07-21 VITALS — BP 126/79 | HR 73 | Temp 98.0°F | Wt 168.2 lb

## 2022-07-21 DIAGNOSIS — O09292 Supervision of pregnancy with other poor reproductive or obstetric history, second trimester: Secondary | ICD-10-CM

## 2022-07-21 DIAGNOSIS — Z3480 Encounter for supervision of other normal pregnancy, unspecified trimester: Secondary | ICD-10-CM

## 2022-07-21 NOTE — Progress Notes (Signed)
[facility]  PRENATAL VISIT NOTE  Subjective:  Carly Stewart is a 35 y.o. [phone] at [redacted]w[redacted]d being seen today for ongoing prenatal care/follow up of elevated BP noted at visit 2 days ago.  She is currently monitored for the following issues for this high-risk pregnancy and has Benign essential hypertension with delivery; Supervision of other normal pregnancy, antepartum; Numbness and tingling both feet x 1 1/2 years; Hx of preeclampsia, prior pregnancy 04/30/15; and Elevated glucose on their problem list.  Patient reports  persistent bilat LE edema to above knees, no significant improvement with elevation of legs/no working for past 2 days. Has bilat leg pain with prolonged standing .  Contractions: Not present. Vag. Bleeding: None.  Movement: Present. Denies HA, RUQ pain, visual sx. Denies leaking of fluid/ROM.   The following portions of the patient's history were reviewed and updated as appropriate: allergies, current medications, past family history, past medical history, past social history, past surgical history and problem list. Problem list updated.  Objective:   Vitals:   07/21/22 1604  BP: 126/79  Pulse: 73  Temp: 98 F (36.7 C)  Weight: 168 lb 3.2 oz (76.3 kg)    Fetal Status: Fetal Heart Rate (bpm): 158 Fundal Height: 36 cm Movement: Present     General:  Alert, oriented and cooperative. Patient is in no acute distress.  Skin: Skin is warm and dry. No rash noted.   Cardiovascular: Normal heart rate noted  Respiratory: Normal respiratory effort, no problems with respiration noted  Abdomen: Soft, gravid, appropriate for gestational age.  Pain/Pressure: Absent     Pelvic: Cervical exam deferred        Extremities: Normal range of motion.  Edema: Deep pitting, indentation remains for a short time  Mental Status: Normal mood and affect. Normal behavior. Normal judgment and thought content.   Assessment and Plan:  Pregnancy:  [id number] at [redacted]w[redacted]d  1. Supervision of other normal pregnancy, antepartum   2. Hx of preeclampsia, prior pregnancy 04/30/15 Reviewed urine ACR from 2 d ago: WNL. BP reassuring today, 1 lb wt loss, and no sx of preeclampsia aside from LE edema. Preeclampsia sx/procedures reviewed with pt. Note provided to be out of work 12/12-19/23, reassess at [facility] 12/19. Elevate legs. Will try to give BP cuff for home use at RV.  Preterm labor symptoms and general obstetric precautions including but not limited to vaginal bleeding, contractions, leaking of fluid and fetal movement were reviewed in detail with the patient. Please refer to After Visit Summary for other counseling recommendations.  Return in about 5 days (around 07/26/2022) for Routine prenatal care.  Future Appointments  Date Time Provider Department Center  07/26/2022  1:20 PM [facility] PROVIDER [facility] None    Landry Dyke, PA-C

## 2022-07-21 NOTE — Progress Notes (Signed)
Counseled to discontinue aspirin after dose tomorrow as EGA will be 36 weeks. Client verbalized understanding. Jossie Ng, RN

## 2022-07-26 ENCOUNTER — Encounter: Payer: Self-pay | Admitting: Nurse Practitioner

## 2022-07-26 ENCOUNTER — Ambulatory Visit: Payer: BC Managed Care – PPO | Admitting: Nurse Practitioner

## 2022-07-26 VITALS — BP 131/85 | HR 84 | Temp 97.4°F | Wt 169.8 lb

## 2022-07-26 DIAGNOSIS — Z3483 Encounter for supervision of other normal pregnancy, third trimester: Secondary | ICD-10-CM

## 2022-07-26 DIAGNOSIS — R059 Cough, unspecified: Secondary | ICD-10-CM

## 2022-07-26 LAB — URINALYSIS
Bilirubin, UA: NEGATIVE
Glucose, UA: NEGATIVE
Ketones, UA: NEGATIVE
Leukocytes,UA: NEGATIVE
Nitrite, UA: NEGATIVE
Protein,UA: NEGATIVE
RBC, UA: NEGATIVE
Specific Gravity, UA: 1.015 (ref 1.005–1.030)
Urobilinogen, Ur: 2 mg/dL — ABNORMAL HIGH (ref 0.2–1.0)
pH, UA: 7 (ref 5.0–7.5)

## 2022-07-26 MED ORDER — BENZONATATE 100 MG PO CAPS: 100.0000 mg | ORAL_CAPSULE | Freq: Three times a day (TID) | ORAL | 0 refills | Status: DC | PRN

## 2022-07-26 NOTE — Progress Notes (Signed)
[place] [facility]  PRENATAL VISIT NOTE  Subjective:  Carly Stewart is a 36 y.o. [phone] at [redacted]w[redacted]d being seen today for ongoing prenatal care.  She is currently monitored for the following issues for this high-risk pregnancy and has Benign essential hypertension with delivery; Supervision of other normal pregnancy, antepartum; Numbness and tingling both feet x 1 1/2 years; Hx of preeclampsia, prior pregnancy 04/30/15; and Elevated glucose on their problem list.  Patient reports  cough, slight pelvic pain, and swelling in lower extremities .  Contractions: Not present. Vag. Bleeding: None.  Movement: Present. Denies leaking of fluid/ROM.   The following portions of the patient's history were reviewed and updated as appropriate: allergies, current medications, past family history, past medical history, past social history, past surgical history and problem list. Problem list updated.  Objective:   Vitals:   07/26/22 1333  BP: 131/85  Pulse: 84  Temp: (!) 97.4 F (36.3 C)  Weight: 169 lb 12.8 oz (77 kg)    Fetal Status: Fetal Heart Rate (bpm): 150 Fundal Height: 37 cm Movement: Present  Presentation: Vertex  General:  Alert, oriented and cooperative. Patient is in no acute distress.  Skin: Skin is warm and dry. No rash noted.   Cardiovascular: Normal heart rate noted  Respiratory: Normal respiratory effort, no problems with respiration noted  Abdomen: Soft, gravid, appropriate for gestational age.  Pain/Pressure: Present     Pelvic: Cervical exam deferred        Extremities: Normal range of motion.  Edema: Moderate pitting, indentation subsides rapidly  Mental Status: Normal mood and affect. Normal behavior. Normal judgment and thought content.   Assessment and Plan:  Pregnancy: [id number] at [redacted]w[redacted]d  1. Prenatal care, subsequent pregnancy in third trimester -35 year old female in clinic for a prenatal care. -ROS reviewed, patient with complaints  of cough, slight pelvic pain, and swelling in lower extremities.  Patient states that she has had a cough for 3 weeks and has tried several medications to improve symptoms.  Will prescribe Tessalon Pearls today and continue to monitor cough.  If cough progresses will prescribe an antibiotic.  Pelvic/pubic pain noted, may be due to baby progressively engaging into the pelvis.  Patient continues to have 2+ pitting edema to lower extremities.  BP 131/85, denies headaches, epigastric pain. Will continue to monitor for signs of preeclampsia.  Urine dip obtained today, which is currently negative.  Will await protein/creatinine ratio.   -Patient reports taking PNV daily.  -36 week cultures obtained today. -Reviewed with patient of when to report to the hospital. -49 lb 12.8 oz (22.6 kg) -Letter written to have patient out of work for the remainder of her pregnancy. -PHQ-9= 7, states score is related to overall not feeling well.  Denies thoughts of self harm.    - Chlamydia/GC NAA, Confirmation - Culture, beta strep (group b only) - Urinalysis (Urine Dip) - Protein / creatinine ratio, urine  2. Cough, unspecified type -Tessalon Pearls prescribed for cough.  Will continue to monitor.  - benzonatate (TESSALON) 100 MG capsule; Take 1 capsule (100 mg total) by mouth 3 (three) times daily as needed for cough.  Dispense: 20 capsule; Refill: 0   Term labor symptoms and general obstetric precautions including but not limited to vaginal bleeding, contractions, leaking of fluid and fetal movement were reviewed in detail with the patient. Please refer to After Visit Summary for other counseling recommendations.   Return in about 1 week (around 08/02/2022) for  Routine prenatal care visit.  Future Appointments  Date Time Provider Department Center  08/05/2022 11:00 AM [facility] PROVIDER [facility] None    Glenna Fellows, FNP

## 2022-07-27 LAB — PROTEIN / CREATININE RATIO, URINE
Creatinine, Urine: 40.6 mg/dL
Protein, Ur: 6.5 mg/dL
Protein/Creat Ratio: 160 mg/g creat (ref 0–200)

## 2022-07-29 ENCOUNTER — Telehealth: Payer: Self-pay

## 2022-07-29 ENCOUNTER — Telehealth: Payer: Self-pay | Admitting: Family Medicine

## 2022-07-29 NOTE — Telephone Encounter (Signed)
As per client request in earlier call today, more specific note written by Glenna Fellows FNP-BC regarding medical reasons for removing client from work until after delivery. Call to client and notified note is available for pick up in [facility]. Client aware agency is close next week until Thursday, 08/04/2022. Jossie Ng, RN

## 2022-07-29 NOTE — Telephone Encounter (Signed)
PT called wanting to talk to someone about getting a note or documents for her medical leave and short term disability.

## 2022-07-29 NOTE — Telephone Encounter (Signed)
Call to client who states letter received at 07/26/2022 appt putting her out of work needs to be more specific and detail medical reasons why out of work. Per client, a note is all that is needed at this time and does not have any paperwork to send [place] to complete. Glenna Fellows FNP-BC notified of above. Jossie Ng, RN

## 2022-07-30 LAB — CULTURE, BETA STREP (GROUP B ONLY): Strep Gp B Culture: NEGATIVE

## 2022-07-30 LAB — CHLAMYDIA/GC NAA, CONFIRMATION
Chlamydia trachomatis, NAA: NEGATIVE
Neisseria gonorrhoeae, NAA: NEGATIVE

## 2022-08-05 ENCOUNTER — Ambulatory Visit: Payer: BC Managed Care – PPO | Admitting: Physician Assistant

## 2022-08-05 VITALS — BP 137/83 | HR 68 | Temp 96.7°F | Wt 164.2 lb

## 2022-08-05 DIAGNOSIS — Z3480 Encounter for supervision of other normal pregnancy, unspecified trimester: Secondary | ICD-10-CM

## 2022-08-05 LAB — URINALYSIS
Bilirubin, UA: NEGATIVE
Glucose, UA: NEGATIVE
Ketones, UA: NEGATIVE
Nitrite, UA: NEGATIVE
RBC, UA: NEGATIVE
Specific Gravity, UA: 1.02 (ref 1.005–1.030)
Urobilinogen, Ur: 1 mg/dL (ref 0.2–1.0)
pH, UA: 7 (ref 5.0–7.5)

## 2022-08-05 NOTE — Progress Notes (Signed)
[facility]  PRENATAL VISIT NOTE  Subjective:  Carly Stewart is a 36 y.o. [phone] at [redacted]w[redacted]d being seen today with daughter for ongoing prenatal care.  She is currently monitored for the following issues for this high-risk pregnancy and has Benign essential hypertension with delivery; Supervision of other normal pregnancy, antepartum; Numbness and tingling both feet x 1 1/2 years; Hx of preeclampsia, prior pregnancy 04/30/15; and Elevated glucose on their problem list.  Patient reports no complaints.  Contractions: Irritability. Vag. Bleeding: None.  Movement: Present.  Denies leaking of fluid/ROM.   The following portions of the patient's history were reviewed and updated as appropriate: allergies, current medications, past family history, past medical history, past social history, past surgical history and problem list. Problem list updated.  Objective:   Vitals:   08/05/22 1054  BP: 137/83  Pulse: 68  Temp: (!) 96.7 F (35.9 C)  Weight: 164 lb 3.2 oz (74.5 kg)    Fetal Status: Fetal Heart Rate (bpm): 144 Fundal Height: 39 cm Movement: Present  Presentation: Vertex  General:  Alert, oriented and cooperative. Patient is in no acute distress.  Skin: Skin is warm and dry. No rash noted.   Cardiovascular: Normal heart rate noted  Respiratory: Normal respiratory effort, no problems with respiration noted  Abdomen: Soft, gravid, appropriate for gestational age.  Pain/Pressure: Absent     Pelvic: Cervical exam deferred        Extremities: Normal range of motion.  Edema: Moderate pitting, indentation subsides rapidly  Mental Status: Normal mood and affect. Normal behavior. Normal judgment and thought content.   Assessment and Plan:  Pregnancy: [id number] at [redacted]w[redacted]d  1. Supervision of other normal pregnancy, antepartum BP elevated at 137/83. Denies HA, visual sx, abdominal pain. Edema is less than prior, but still present. Reviewed protein/creat  ratio from 07/26/22 = WNL at 160, though higher than 07/19/22 result of 120. CCUA pro today = 1+. Urine P/C pending. Preeclampsia sx/procedures reviewed with pt. - Urinalysis - Protein / creatinine ratio, urine  Term labor symptoms and general obstetric precautions including but not limited to vaginal bleeding, contractions, leaking of fluid and fetal movement were reviewed in detail with the patient. Please refer to After Visit Summary for other counseling recommendations.  Return in about 1 week (around 08/12/2022) for Routine prenatal care.  Future Appointments  Date Time Provider Department Center  08/12/2022  2:20 PM [facility] PROVIDER [facility] None    Landry Dyke, PA-C

## 2022-08-06 LAB — PROTEIN / CREATININE RATIO, URINE
Creatinine, Urine: 152.5 mg/dL
Protein, Ur: 29.6 mg/dL
Protein/Creat Ratio: 194 mg/g creat (ref 0–200)

## 2022-08-08 NOTE — L&D Delivery Note (Signed)
Delivery Note  First Stage: Labor onset: 0700 Augmentation : none Analgesia /Anesthesia intrapartum: none SROM at 1042, meconium stained fluid.   Second Stage: Complete dilation at 1045 Onset of pushing at 1045 FHR second stage cat I  Delivery of a viable female infant on 08/17/22 at 1046  by CNM delivery of fetal head in OA position with restitution to ROA. No nuchal cord;  Anterior then posterior shoulders delivered easily with gentle downward traction. Baby placed on mom's chest, and attended to by peds.  Cord double clamped after cessation of pulsation, cut by FOB   Third Stage: Placenta delivered spontaneously intact with 3 VC @ 1052 Placenta disposition: routine disposal Uterine tone Firm / bleeding scant  NO laceration identified  Anesthesia for repair: n/a Repair n/a Est. Blood Loss (mL): 828  Complications: none  Mom to postpartum.  Baby to Couplet care / Skin to Skin.  Newborn: Birth Weight: pending  Apgar Scores: 8/9 Feeding planned: formula

## 2022-08-12 ENCOUNTER — Ambulatory Visit: Payer: BC Managed Care – PPO | Admitting: Family Medicine

## 2022-08-12 VITALS — BP 144/83 | HR 72 | Temp 97.6°F | Wt 168.6 lb

## 2022-08-12 DIAGNOSIS — Z3480 Encounter for supervision of other normal pregnancy, unspecified trimester: Secondary | ICD-10-CM

## 2022-08-12 DIAGNOSIS — Z3483 Encounter for supervision of other normal pregnancy, third trimester: Secondary | ICD-10-CM

## 2022-08-12 DIAGNOSIS — O09293 Supervision of pregnancy with other poor reproductive or obstetric history, third trimester: Secondary | ICD-10-CM

## 2022-08-12 DIAGNOSIS — O09292 Supervision of pregnancy with other poor reproductive or obstetric history, second trimester: Secondary | ICD-10-CM

## 2022-08-12 LAB — URINALYSIS
Bilirubin, UA: NEGATIVE
Glucose, UA: NEGATIVE
Ketones, UA: NEGATIVE
Leukocytes,UA: NEGATIVE
Nitrite, UA: NEGATIVE
Protein,UA: NEGATIVE
Specific Gravity, UA: 1.015 (ref 1.005–1.030)
Urobilinogen, Ur: 2 mg/dL — ABNORMAL HIGH (ref 0.2–1.0)
pH, UA: 7 (ref 5.0–7.5)

## 2022-08-12 NOTE — Progress Notes (Addendum)
[place] [facility]  PRENATAL VISIT NOTE  Subjective:  Carly Stewart is a 37 y.o. [phone] at [redacted]w[redacted]d being seen today for ongoing prenatal care.  She is currently monitored for the following issues for this high-risk pregnancy and has Benign essential hypertension with delivery; Supervision of other normal pregnancy, antepartum; Numbness and tingling both feet x 1 1/2 years; Hx of preeclampsia, prior pregnancy 04/30/15; and Elevated glucose on their problem list.  Patient reports headache, heartburn, and occasional contractions.  Contractions: Irregular. Vag. Bleeding: None.  Movement: Present. Denies leaking of fluid/ROM.   The following portions of the patient's history were reviewed and updated as appropriate: allergies, current medications, past family history, past medical history, past social history, past surgical history and problem list. Problem list updated.  Objective:   Vitals:   08/12/22 1414  BP: (!) 144/83  Pulse: 72  Temp: 97.6 F (36.4 C)  Weight: 168 lb 9.6 oz (76.5 kg)  REPEAT BP 135/85  Fetal Status: Fetal Heart Rate (bpm): 144 Fundal Height: 40 cm Movement: Present  Presentation: Vertex  General:  Alert, oriented and cooperative. Patient is in no acute distress.  Skin: Skin is warm and dry. No rash noted.   Cardiovascular: Normal heart rate noted  Respiratory: Normal respiratory effort, no problems with respiration noted  Abdomen: Soft, gravid, appropriate for gestational age.  Pain/Pressure: Present     Pelvic: Cervical exam performed Dilation: 1 Effacement (%): 50 Station: -3  Extremities: Normal range of motion.  Edema: Mild pitting, slight indentation  Mental Status: Normal mood and affect. Normal behavior. Normal judgment and thought content.   Assessment and Plan:  Pregnancy: [id number] at [redacted]w[redacted]d  1. Supervision of other normal pregnancy, antepartum -37 yo G5P3 in clinic for routine PNC -stopped working-last day- Dec  12 -48 lb 9.6 oz (22 kg) -IOL forms for [facility] done today -reviewed Urinalysis and protein/creatinine ratio from last week (See below)  2. Hx of preeclampsia, prior pregnancy 04/30/15 Protein/Creatinine ratios from previous visits reviewed:  12/12-120 12/19-160 12/29-194, +1 protein on urinalysis 1/5 protein/creat ration pending 1/5- no protein on urinalysis  Previous BP readings: 12/8- in ER with cough- 146/77 12/12- 135/84 12/19- 131/85 12/29- 137/83  Will repeat urinalysis and Protein/Creatinine ratios today. Patient has also had +1-2 pitting edema in BLE since early December.  -Gained ~4.3 pounds in last week -First BP today 144/83, repeat 135/85 -in the last week- endorses 2-3 mild headaches, seen spots x2 while getting into shower, and denies epigastric pain -BP cuff not available to give patient Advised patient to come back in 3 days (Monday) to evaluate BP, edema and symptoms. Patient at risk for preeclampsia.  Term labor symptoms and general obstetric precautions including but not limited to vaginal bleeding, contractions, leaking of fluid and fetal movement were reviewed in detail with the patient. Please refer to After Visit Summary for other counseling recommendations.  Return in about 3 days (around 08/15/2022) for Routine Prenatal Care.  Future Appointments  Date Time Provider [place]  08/15/2022  8:20 AM [facility] PROVIDER [facility] None    Sharlet Salina, FNP Attestation of Supervision of Advanced Practitioner (CNM/PA/NP): Evaluation and management procedures were performed by the Advanced Practice Provider under my supervision and collaboration.  I have reviewed the Advanced Practice Provider's note and chart, and I agree with the management and plan. I have also made any necessary editorial changes.   I was working along side this practitioner all day and all medical plans were  discussed with me.  Donnal Moat, CNM

## 2022-08-13 LAB — PROTEIN / CREATININE RATIO, URINE
Creatinine, Urine: 124.7 mg/dL
Protein, Ur: 18.4 mg/dL
Protein/Creat Ratio: 148 mg/g creat (ref 0–200)

## 2022-08-15 ENCOUNTER — Ambulatory Visit: Payer: BC Managed Care – PPO

## 2022-08-15 ENCOUNTER — Ambulatory Visit: Payer: BC Managed Care – PPO | Admitting: Advanced Practice Midwife

## 2022-08-15 VITALS — BP 141/86 | HR 65 | Temp 97.8°F | Wt 168.8 lb

## 2022-08-15 DIAGNOSIS — O09292 Supervision of pregnancy with other poor reproductive or obstetric history, second trimester: Secondary | ICD-10-CM

## 2022-08-15 DIAGNOSIS — O1002 Pre-existing essential hypertension complicating childbirth: Secondary | ICD-10-CM

## 2022-08-15 DIAGNOSIS — Z3480 Encounter for supervision of other normal pregnancy, unspecified trimester: Secondary | ICD-10-CM

## 2022-08-15 DIAGNOSIS — Z3483 Encounter for supervision of other normal pregnancy, third trimester: Secondary | ICD-10-CM

## 2022-08-15 LAB — URINALYSIS
Bilirubin, UA: NEGATIVE
Glucose, UA: NEGATIVE
Ketones, UA: NEGATIVE
Nitrite, UA: NEGATIVE
Protein,UA: NEGATIVE
Specific Gravity, UA: 1.01 (ref 1.005–1.030)
Urobilinogen, Ur: 1 mg/dL (ref 0.2–1.0)
pH, UA: 7 (ref 5.0–7.5)

## 2022-08-15 NOTE — Progress Notes (Signed)
Urine dip today due to elevated BP. Rich Number, RN Urine dip

## 2022-08-15 NOTE — Progress Notes (Signed)
[facility]  PRENATAL VISIT NOTE  Subjective:  Carly Stewart is a 37 y.o. [phone] at [redacted]w[redacted]d being seen today for ongoing prenatal care.  She is currently monitored for the following issues for this high-risk pregnancy and has Benign essential hypertension with delivery; Supervision of other normal pregnancy, antepartum; Numbness and tingling both feet x 1 1/2 years; Hx of preeclampsia, prior pregnancy 04/30/15; and Elevated glucose on their problem list.  Patient reports  miseries of pregnancy, edema .  Contractions: Irregular. Vag. Bleeding: None.  Movement: Present. Denies leaking of fluid/ROM.   The following portions of the patient's history were reviewed and updated as appropriate: allergies, current medications, past family history, past medical history, past social history, past surgical history and problem list. Problem list updated.  Objective:   Vitals:   08/15/22 0840  BP: (!) 141/86  Pulse: 65  Temp: 97.8 F (36.6 C)  Weight: 168 lb 12.8 oz (76.6 kg)    Fetal Status: Fetal Heart Rate (bpm): 135 Fundal Height: 39 cm Movement: Present  Presentation: Vertex  General:  Alert, oriented and cooperative. Patient is in no acute distress.  Skin: Skin is warm and dry. No rash noted.   Cardiovascular: Normal heart rate noted  Respiratory: Normal respiratory effort, no problems with respiration noted  Abdomen: Soft, gravid, appropriate for gestational age.  Pain/Pressure: Absent     Pelvic: Cervical exam deferred        Extremities: Normal range of motion.  Edema: Moderate pitting, indentation subsides rapidly  Mental Status: Normal mood and affect. Normal behavior. Normal judgment and thought content.   Assessment and Plan:  Pregnancy: [id number] at [redacted]w[redacted]d   2. Supervision of other normal pregnancy, antepartum 48 lb 12.8 oz (22.1 kg) BP 141/86; 2+ LE edema, states had 1 h/a this am since last apt; denies scotoma, epigastric pain;  u/a=neg proteinuria C/o u/c's q 30-40 min last night Knows when to go to L&D On 08/12/22 BP=144/83, neg proteinuria, spot protein/creat ratio=148 Ready for baby at home Not working since 07/19/22 RTC in 3 days for evaluation and close monitoring BTL papers signed IOL paperwork faxed on 08/12/22  - Urinalysis (Urine Dip)  3. Hx of preeclampsia, prior pregnancy 04/30/15 Monitor closely   Term labor symptoms and general obstetric precautions including but not limited to vaginal bleeding, contractions, leaking of fluid and fetal movement were reviewed in detail with the patient. Please refer to After Visit Summary for other counseling recommendations.  Return in about 3 days (around 08/18/2022) for BP check and RV.  No future appointments.  Herbie Saxon, CNM

## 2022-08-16 ENCOUNTER — Other Ambulatory Visit: Payer: Self-pay | Admitting: Obstetrics and Gynecology

## 2022-08-16 DIAGNOSIS — O133 Gestational [pregnancy-induced] hypertension without significant proteinuria, third trimester: Secondary | ICD-10-CM

## 2022-08-16 NOTE — Progress Notes (Signed)
[id number] at [redacted]w[redacted]d, LMP of 11/11/21, c/w early [place] at [redacted]w[redacted]d.  Scheduled for induction of labor for GHTN on 08/19/22.   Prenatal provider: [facility] Pregnancy complicated by: History of preeclampsia GHTN Elevated 1 hr gtt, passed 3 hr Tingling/numbness in hands and feet x 1.5 yrs  Prenatal Labs: Blood type/Rh AB  Antibody screen neg  Rubella unknown    Varicella unknown  RPR NR  HBsAg Negative (06/22 1034)   Hep C NR  HIV   NR  GC neg  Chlamydia neg  Genetic screening cfDNA negative  1 hour GTT 163  3 hour GTT 41,96,22,29  GBS Negative/-- (12/19 1445)    Tdap: 05/25/22 Flu: declined Contraception: Tubal Ligation Feeding preference: TBD  ____ Avelino Leeds, CNM Certified Nurse Midwife [facility]

## 2022-08-17 ENCOUNTER — Inpatient Hospital Stay
Admission: EM | Admit: 2022-08-17 | Discharge: 2022-08-19 | DRG: 797 | Disposition: A | Discharge status: Home / Self Care | Payer: BC Managed Care – PPO | Attending: Obstetrics and Gynecology | Admitting: Obstetrics and Gynecology

## 2022-08-17 ENCOUNTER — Other Ambulatory Visit: Payer: Self-pay

## 2022-08-17 ENCOUNTER — Encounter: Payer: Self-pay | Admitting: Obstetrics and Gynecology

## 2022-08-17 DIAGNOSIS — Z302 Encounter for sterilization: Secondary | ICD-10-CM | POA: Diagnosis not present

## 2022-08-17 DIAGNOSIS — O26893 Other specified pregnancy related conditions, third trimester: Secondary | ICD-10-CM | POA: Diagnosis present

## 2022-08-17 DIAGNOSIS — O9902 Anemia complicating childbirth: Secondary | ICD-10-CM | POA: Diagnosis present

## 2022-08-17 DIAGNOSIS — Z3490 Encounter for supervision of normal pregnancy, unspecified, unspecified trimester: Secondary | ICD-10-CM | POA: Diagnosis present

## 2022-08-17 DIAGNOSIS — Z3480 Encounter for supervision of other normal pregnancy, unspecified trimester: Principal | ICD-10-CM

## 2022-08-17 DIAGNOSIS — Z3A39 39 weeks gestation of pregnancy: Secondary | ICD-10-CM | POA: Diagnosis not present

## 2022-08-17 DIAGNOSIS — R7309 Other abnormal glucose: Secondary | ICD-10-CM

## 2022-08-17 DIAGNOSIS — O134 Gestational [pregnancy-induced] hypertension without significant proteinuria, complicating childbirth: Secondary | ICD-10-CM | POA: Diagnosis present

## 2022-08-17 DIAGNOSIS — D62 Acute posthemorrhagic anemia: Secondary | ICD-10-CM | POA: Diagnosis not present

## 2022-08-17 DIAGNOSIS — Z87891 Personal history of nicotine dependence: Secondary | ICD-10-CM

## 2022-08-17 DIAGNOSIS — O133 Gestational [pregnancy-induced] hypertension without significant proteinuria, third trimester: Secondary | ICD-10-CM

## 2022-08-17 DIAGNOSIS — Z3483 Encounter for supervision of other normal pregnancy, third trimester: Secondary | ICD-10-CM | POA: Diagnosis not present

## 2022-08-17 HISTORY — DX: Encounter for supervision of normal pregnancy, unspecified, unspecified trimester: Z34.90

## 2022-08-17 LAB — COMPREHENSIVE METABOLIC PANEL
ALT: 7 U/L (ref 0–44)
AST: 25 U/L (ref 15–41)
Albumin: 3 g/dL — ABNORMAL LOW (ref 3.5–5.0)
Alkaline Phosphatase: 202 U/L — ABNORMAL HIGH (ref 38–126)
Anion gap: 10 (ref 5–15)
BUN: <5 mg/dL — ABNORMAL LOW (ref 6–20)
CO2: 19 mmol/L — ABNORMAL LOW (ref 22–32)
Calcium: 8.7 mg/dL — ABNORMAL LOW (ref 8.9–10.3)
Chloride: 107 mmol/L (ref 98–111)
Creatinine, Ser: 0.6 mg/dL (ref 0.44–1.00)
GFR, Estimated: >60 mL/min (ref >60)
Glucose, Bld: 107 mg/dL — ABNORMAL HIGH (ref 70–99)
Potassium: 2.6 mmol/L — CL (ref 3.5–5.1)
Sodium: 136 mmol/L (ref 135–145)
Total Bilirubin: 0.9 mg/dL (ref 0.3–1.2)
Total Protein: 6.8 g/dL (ref 6.5–8.1)

## 2022-08-17 LAB — CBC WITH DIFFERENTIAL/PLATELET
Abs Immature Granulocytes: 0.01 10*3/uL (ref 0.00–0.07)
Basophils Absolute: 0 10*3/uL (ref 0.0–0.1)
Basophils Relative: 1 %
Eosinophils Absolute: 0 10*3/uL (ref 0.0–0.5)
Eosinophils Relative: 0 %
HCT: 35.4 % — ABNORMAL LOW (ref 36.0–46.0)
Hemoglobin: 12.1 g/dL (ref 12.0–15.0)
Immature Granulocytes: 0 %
Lymphocytes Relative: 23 %
Lymphs Abs: 1.7 10*3/uL (ref 0.7–4.0)
MCH: 30.6 pg (ref 26.0–34.0)
MCHC: 34.2 g/dL (ref 30.0–36.0)
MCV: 89.4 fL (ref 80.0–100.0)
Monocytes Absolute: 0.5 10*3/uL (ref 0.1–1.0)
Monocytes Relative: 7 %
Neutro Abs: 5.1 10*3/uL (ref 1.7–7.7)
Neutrophils Relative %: 69 %
Platelets: 201 10*3/uL (ref 150–400)
RBC: 3.96 MIL/uL (ref 3.87–5.11)
RDW: 13 % (ref 11.5–15.5)
WBC: 7.4 10*3/uL (ref 4.0–10.5)
nRBC: 0 % (ref 0.0–0.2)

## 2022-08-17 LAB — PROTEIN / CREATININE RATIO, URINE
Creatinine, Urine: 97 mg/dL
Protein Creatinine Ratio: 0.35 mg/mg{Cre} — ABNORMAL HIGH (ref 0.00–0.15)
Total Protein, Urine: 34 mg/dL

## 2022-08-17 MED ORDER — COCONUT OIL OIL: 1.0000 | TOPICAL_OIL | Status: DC | PRN

## 2022-08-17 MED ORDER — OXYTOCIN BOLUS FROM INFUSION
333.0000 mL | Freq: Once | INTRAVENOUS | Status: AC
  Administered 2022-08-17: 333 mL via INTRAVENOUS

## 2022-08-17 MED ORDER — BENZOCAINE-MENTHOL 20-0.5 % EX AERO
1.0000 | INHALATION_SPRAY | CUTANEOUS | Status: DC | PRN
  Filled 2022-08-17: qty 56

## 2022-08-17 MED ORDER — IBUPROFEN 600 MG PO TABS
600.0000 mg | ORAL_TABLET | Freq: Four times a day (QID) | ORAL | Status: DC
  Administered 2022-08-17 – 2022-08-19 (×8): 600 mg via ORAL
  Filled 2022-08-17 (×8): qty 1

## 2022-08-17 MED ORDER — ONDANSETRON HCL 4 MG PO TABS: 4.0000 mg | ORAL_TABLET | ORAL | Status: DC | PRN

## 2022-08-17 MED ORDER — SOD CITRATE-CITRIC ACID 500-334 MG/5ML PO SOLN: 30.0000 mL | ORAL | Status: DC | PRN

## 2022-08-17 MED ORDER — LACTATED RINGERS IV SOLN: 500.0000 mL | INTRAVENOUS | Status: DC | PRN

## 2022-08-17 MED ORDER — ZOLPIDEM TARTRATE 5 MG PO TABS: 5.0000 mg | ORAL_TABLET | Freq: Every evening | ORAL | Status: DC | PRN

## 2022-08-17 MED ORDER — SIMETHICONE 80 MG PO CHEW: 80.0000 mg | CHEWABLE_TABLET | ORAL | Status: DC | PRN

## 2022-08-17 MED ORDER — LIDOCAINE HCL (PF) 1 % IJ SOLN: 30.0000 mL | INTRAMUSCULAR | Status: DC | PRN

## 2022-08-17 MED ORDER — ACETAMINOPHEN 325 MG PO TABS
650.0000 mg | ORAL_TABLET | ORAL | Status: DC | PRN
  Administered 2022-08-17: 650 mg via ORAL
  Filled 2022-08-17: qty 2

## 2022-08-17 MED ORDER — HYDRALAZINE HCL 20 MG/ML IJ SOLN: 10.0000 mg | INTRAMUSCULAR | Status: DC | PRN

## 2022-08-17 MED ORDER — LACTATED RINGERS IV SOLN: INTRAVENOUS | Status: DC

## 2022-08-17 MED ORDER — FENTANYL CITRATE (PF) 100 MCG/2ML IJ SOLN: 50.0000 ug | INTRAMUSCULAR | Status: DC | PRN

## 2022-08-17 MED ORDER — DIBUCAINE (PERIANAL) 1 % EX OINT: 1.0000 | TOPICAL_OINTMENT | CUTANEOUS | Status: DC | PRN

## 2022-08-17 MED ORDER — WITCH HAZEL-GLYCERIN EX PADS
1.0000 | MEDICATED_PAD | CUTANEOUS | Status: DC | PRN
  Filled 2022-08-17: qty 100

## 2022-08-17 MED ORDER — LABETALOL HCL 5 MG/ML IV SOLN: 80.0000 mg | INTRAVENOUS | Status: DC | PRN

## 2022-08-17 MED ORDER — SENNOSIDES-DOCUSATE SODIUM 8.6-50 MG PO TABS
2.0000 | ORAL_TABLET | Freq: Every day | ORAL | Status: DC
  Administered 2022-08-18 – 2022-08-19 (×2): 2 via ORAL
  Filled 2022-08-17 (×2): qty 2

## 2022-08-17 MED ORDER — DIPHENHYDRAMINE HCL 25 MG PO CAPS: 25.0000 mg | ORAL_CAPSULE | Freq: Four times a day (QID) | ORAL | Status: DC | PRN

## 2022-08-17 MED ORDER — ACETAMINOPHEN 325 MG PO TABS: 650.0000 mg | ORAL_TABLET | ORAL | Status: DC | PRN

## 2022-08-17 MED ORDER — FERROUS SULFATE 325 (65 FE) MG PO TABS
325.0000 mg | ORAL_TABLET | Freq: Two times a day (BID) | ORAL | Status: DC
  Administered 2022-08-17 – 2022-08-19 (×4): 325 mg via ORAL
  Filled 2022-08-17 (×4): qty 1

## 2022-08-17 MED ORDER — PRENATAL MULTIVITAMIN CH
1.0000 | ORAL_TABLET | Freq: Every day | ORAL | Status: DC
  Administered 2022-08-17 – 2022-08-19 (×3): 1 via ORAL
  Filled 2022-08-17 (×3): qty 1

## 2022-08-17 MED ORDER — OXYTOCIN-SODIUM CHLORIDE 30-0.9 UT/500ML-% IV SOLN
2.5000 [IU]/h | INTRAVENOUS | Status: DC
  Administered 2022-08-17: 2.5 [IU]/h via INTRAVENOUS

## 2022-08-17 MED ORDER — OXYTOCIN-SODIUM CHLORIDE 30-0.9 UT/500ML-% IV SOLN
INTRAVENOUS | Status: AC
  Administered 2022-08-17: 0.06 [IU]
  Filled 2022-08-17: qty 500

## 2022-08-17 MED ORDER — ONDANSETRON HCL 4 MG/2ML IJ SOLN: 4.0000 mg | Freq: Four times a day (QID) | INTRAMUSCULAR | Status: DC | PRN

## 2022-08-17 MED ORDER — LABETALOL HCL 5 MG/ML IV SOLN: 40.0000 mg | INTRAVENOUS | Status: DC | PRN

## 2022-08-17 MED ORDER — ONDANSETRON HCL 4 MG/2ML IJ SOLN: 4.0000 mg | INTRAMUSCULAR | Status: DC | PRN

## 2022-08-17 MED ORDER — LABETALOL HCL 200 MG PO TABS
200.0000 mg | ORAL_TABLET | Freq: Two times a day (BID) | ORAL | Status: DC
  Administered 2022-08-17 – 2022-08-19 (×5): 200 mg via ORAL
  Filled 2022-08-17 (×2): qty 1
  Filled 2022-08-17: qty 2
  Filled 2022-08-17 (×2): qty 1

## 2022-08-17 MED ORDER — LABETALOL HCL 5 MG/ML IV SOLN: 20.0000 mg | INTRAVENOUS | Status: DC | PRN

## 2022-08-17 MED ORDER — POTASSIUM CHLORIDE CRYS ER 20 MEQ PO TBCR
40.0000 meq | EXTENDED_RELEASE_TABLET | Freq: Two times a day (BID) | ORAL | Status: DC
  Administered 2022-08-17 – 2022-08-19 (×5): 40 meq via ORAL
  Filled 2022-08-17 (×2): qty 2
  Filled 2022-08-17: qty 4
  Filled 2022-08-17 (×2): qty 2

## 2022-08-17 NOTE — Discharge Summary (Signed)
Obstetrical Discharge Summary  Patient Name: Carly Stewart DOB: 15-Feb-1986 MRN: [id number]  Date of Admission: 08/17/2022 Date of Delivery: 08/17/22 Delivered by: Magda Kiel CNM  Date of Discharge: 08/19/2022  Primary OB: [facility] GDJ:MEQASTM'H last menstrual period was 11/11/2021 (approximate). EDC Estimated Date of Delivery: 08/19/22 Gestational Age at Delivery: [redacted]w[redacted]d   Antepartum complications:  History of preeclampsia GHTN Elevated 1 hr gtt, passed 3 hr Tingling/numbness in hands and feet x 1.5 yrs  Admitting Diagnosis: Encounter for planned induction of labor [Z34.90] Labor and delivery, indication for care [O75.9]  Secondary Diagnosis: Patient Active Problem List   Diagnosis Date Noted   Cesarean delivery delivered 08/19/2022   Elevated glucose 05/27/2022   Hx of preeclampsia, prior pregnancy 04/30/15 04/13/2022   Supervision of other normal pregnancy, antepartum 01/27/2022   Numbness and tingling both feet x 1 1/2 years 01/27/2022   Benign essential hypertension with delivery 05/03/2015    Discharge Diagnosis: Term Pregnancy Delivered and Gestational Hypertension      Augmentation: N/A Complications: None Intrapartum complications/course: admitted in active labor with SROM and rapid progression to delivery. See delivery note.  Delivery Type: spontaneous vaginal delivery Anesthesia: non-pharmacological methods Placenta: spontaneous To Pathology: No  Laceration: none Episiotomy: none Newborn Data: Live born female  Birth Weight:  3710g (8lb2.9oz) APGAR: 8, 9  Newborn Delivery   Birth date/time: 08/17/2022 10:46:00 Delivery type: Vaginal, Spontaneous      Postpartum Procedures:  none Edinburgh:     08/18/2022    8:10 PM  Edinburgh Postnatal Depression Scale Screening Tool  I have been able to laugh and see the funny side of things. 0  I have looked forward with enjoyment to things. 0  I have blamed myself unnecessarily when things went wrong. 0  I have been anxious  or worried for no good reason. 1  I have felt scared or panicky for no good reason. 1  Things have been getting on top of me. 0  I have been so unhappy that I have had difficulty sleeping. 0  I have felt sad or miserable. 0  I have been so unhappy that I have been crying. 0  The thought of harming myself has occurred to me. 0  Edinburgh Postnatal Depression Scale Total 2     Post partum course:  Patient had an uncomplicated postpartum course.  By time of discharge on PPD#2, her pain was controlled on oral pain medications; she had appropriate lochia and was ambulating, voiding without difficulty and tolerating regular diet.  She was deemed stable for discharge to home.       Discharge Physical Exam:  BP (!) 159/95 (BP Location: Right Arm)   Pulse 64   Temp 98.1 F (36.7 C) (Oral)   Resp 18   Ht 5\' 3"  (1.6 m)   Wt 76.2 kg   LMP 11/11/2021 (Approximate) Comment: delivery, 08/17/22  SpO2 97%   Breastfeeding No   BMI 29.76 kg/m   General: NAD CV: RRR Pulm: CTABL, nl effort ABD: s/nd/nt, fundus firm and below the umbilicus Lochia: moderate Perineum:minimal edema/intact Incision: n/a DVT Evaluation: LE non-ttp, no evidence of DVT on exam.  Hemoglobin  Date Value Ref Range Status  08/18/2022 9.3 (L) 12.0 - 15.0 g/dL Final  01/27/2022 13.1 11.1 - 15.9 g/dL Final   HCT  Date Value Ref Range Status  08/18/2022 27.8 (L) 36.0 - 46.0 % Final   Hematocrit  Date Value Ref Range Status  01/27/2022 37.8 34.0 - 46.6 % Final  Risk assessment for postpartum VTE and prophylactic treatment: Very high risk factors: None High risk factors: None Moderate risk factors: None  Postpartum VTE prophylaxis with LMWH not indicated  Disposition: stable, discharge to home. Baby Feeding: formula feeding Baby Disposition: home with mom  Rh Immune globulin indicated: No Rubella vaccine given: was not indicated Varivax vaccine given: was not indicated Flu vaccine given in AP setting:  No- declined Tdap vaccine given in AP setting: Yes   Contraception: bilateral tubal ligation,  performed 08/18/22  Prenatal Labs:   Blood type/Rh AB Pos  Antibody screen neg  Rubella Immune  Varicella Immune  RPR NR  HBsAg Neg  HIV NR  GC neg  Chlamydia neg  Genetic screening negative  1 hour GTT  163  3 hour GTT  [id number]  GBS  Neg     Plan:  Carly Stewart was discharged to home in good condition.   Discharge Medications: Allergies as of 08/19/2022   No Known Allergies      Medication List     STOP taking these medications    Medical Compression Stockings Misc       TAKE these medications    acetaminophen 325 MG tablet Commonly known as: Tylenol Take 2 tablets (650 mg total) by mouth every 6 (six) hours as needed (for pain scale < 4).   ferrous sulfate 325 (65 FE) MG tablet Take 1 tablet (325 mg total) by mouth 2 (two) times daily with a meal.   ibuprofen 600 MG tablet Commonly known as: ADVIL Take 1 tablet (600 mg total) by mouth every 6 (six) hours as needed.   labetalol 200 MG tablet Commonly known as: NORMODYNE Take 2 tablets (400 mg total) by mouth 2 (two) times daily.   NIFEdipine 90 MG 24 hr tablet Commonly known as: Procardia XL Take 1 tablet (90 mg total) by mouth daily.   oxyCODONE 5 MG immediate release tablet Commonly known as: Roxicodone Take 1 tablet (5 mg total) by mouth every 8 (eight) hours as needed.   Prenatal Vitamins 28-0.8 MG Tabs Take 1 tablet by mouth daily.          Signed: Clydene Laming, CNM 08/19/2022 3:30 PM

## 2022-08-17 NOTE — Progress Notes (Signed)
Vitals:   08/17/22 1014 08/17/22 1042 08/17/22 1049 08/17/22 1052  BP: (!) 156/97 133/81 (!) 152/83 (!) 156/110   08/17/22 1054 08/17/22 1109 08/17/22 1125 08/17/22 1139  BP: (!) 150/84 (!) 150/88 (!) 159/85 (!) 150/83     Notified of elevated BP and low Potassium level by nursing, discussed with Dr Glennon Mac. New orders placed for PO Labetalol 200mg  BID and PO K-Dur 31meq BID.  CMP for AM ordered.   Francetta Found, CNM 08/17/2022

## 2022-08-17 NOTE — Progress Notes (Signed)
Patient ID: Carly Stewart, female   DOB: Jun 19, 1986, 37 y.o.   MRN: [id number] Pt is s/p SVD uncomplicated . EBL 200 cc  Pt G5P4  and she desires a PP BTL tomorrow . No h/o STI , no abd surgeries or CPP .  I have counseled her regarding the risks of the procedure and failure rate 1: 300 / yr All questions answered . Consent signed  NPO > 2400

## 2022-08-17 NOTE — H&P (Signed)
OB History & Physical   History of Present Illness:  Chief Complaint: active labor  HPI:  Carly Stewart is a 37 y.o. [id number] female at [redacted]w[redacted]d dated by [place] at [redacted]w[redacted]d; EDD 08/19/22.  She presents to L&D for active labor, reports UCs atarted last night and got significantly worse this AM at 0700. Reports no bleeding, LOF, reports Active FM    Pregnancy Issues: History of preeclampsia GHTN Elevated 1 hr gtt, passed 3 hr Tingling/numbness in hands and feet x 1.5 yrs   Maternal Medical History:   Past Medical History:  Diagnosis Date   Anemia    Headache    Pregnancy induced hypertension     Past Surgical History:  Procedure Laterality Date   MASS EXCISION Right 12/04/2015   Procedure: RIGHT HAND EXCISION  CARPAL BOS;  Surgeon: Leanor Kail, MD;  Location: [facility];  Service: Orthopedics;  Laterality: Right;   NO PAST SURGERIES      No Known Allergies  Prior to Admission medications   Medication Sig Start Date End Date Taking? Authorizing Provider  Prenatal Vit-Fe Fumarate-FA (PRENATAL VITAMINS) 28-0.8 MG TABS Take 1 tablet by mouth daily. 05/11/22  Yes Caren Macadam, MD  Elastic Bandages & Supports ([place]) [place] Please provide 74mmHg compression stockings Patient not taking: Reported on 07/21/2022 07/15/22   Harvest Dark, MD     Prenatal care site:  [facility]   Social History: She  reports that she has quit smoking. She has never been exposed to tobacco smoke. She has never used smokeless tobacco. She reports that she does not currently use alcohol. She reports that she does not currently use drugs.  Family History: family history includes Asthma in her brother, daughter, and son; Diabetes in her mother; Hypertension in her mother.   Review of Systems: A full review of systems was performed and negative except as noted in the HPI.     Physical Exam:  Vital Signs: BP (!) 156/97   Pulse 82   Ht 5\' 3"  (1.6  m)   Wt 76.2 kg   LMP 11/11/2021 (Approximate)   SpO2 96%   BMI 29.76 kg/m  General: no acute distress.  HEENT: normocephalic, atraumatic Heart: regular rate & rhythm.  No murmurs/rubs/gallops Lungs: clear to auscultation bilaterally, normal respiratory effort Abdomen: soft, gravid, non-tender;  EFW: 7lbs Pelvic:   External: Normal external female genitalia  Cervix: Dilation: 10 / Effacement (%): 60 / Station: Plus 1    Extremities: non-tender, symmetric, 1-2+ edema bilaterally.  DTRs: 2+  Neurologic: Alert & oriented x 3.    No results found for this or any previous visit (from the past 24 hour(s)).  Pertinent Results:  Prenatal Labs: Blood type/Rh AB Pos  Antibody screen neg  Rubella Immune  Varicella Immune  RPR NR  HBsAg Neg  HIV NR  GC neg  Chlamydia neg  Genetic screening negative  1 hour GTT  163  3 hour GTT  [phone]  GBS  Neg   FHT: 130bpm, mod var, + accels, no decels TOCO: q2-4,min SVE:  Dilation: 10 / Effacement (%): 60 / Station: Plus 1  SROM mod amoutn light meconium at 2130   Cephalic by leopolds/SVE  No results found.  Assessment:  Carly Stewart is a 37 y.o. [phone] female at [redacted]w[redacted]d with active labor.   Plan:  1. Admit to Labor & Delivery; consents reviewed and obtained - see delivery summary as pt delivered prior to completion of H&P  2. Fetal Well being  - Fetal Tracing: cat I - Group B Streptococcus ppx indicated: Neg - Presentation: cephalic confirmed by exam   3. Routine OB: - Prenatal labs reviewed, as above - Rh AB Pos - CBC, T&S, RPR on admit - Clear fluids, IVF  4. Monitoring of Labor -  Contractions: external toco in place -  Pelvis proven to 3200 -  Plan for continuous fetal monitoring  -  Maternal pain control as desired - Anticipate vaginal delivery  5. Post Partum Planning: - Infant feeding: formula - Contraception: desires PP BTL - Tdap  05/25/22 - declined Flu vaccine  Francetta Found,  CNM 08/17/22 10:56 AM

## 2022-08-18 ENCOUNTER — Encounter
Admission: EM | Disposition: A | Discharge status: Home / Self Care | Payer: Self-pay | Source: Home / Self Care | Attending: Obstetrics and Gynecology

## 2022-08-18 ENCOUNTER — Ambulatory Visit: Payer: BC Managed Care – PPO

## 2022-08-18 ENCOUNTER — Other Ambulatory Visit: Payer: Self-pay

## 2022-08-18 ENCOUNTER — Inpatient Hospital Stay: Payer: BC Managed Care – PPO | Admitting: Certified Registered Nurse Anesthetist

## 2022-08-18 ENCOUNTER — Encounter: Payer: Self-pay | Admitting: Obstetrics and Gynecology

## 2022-08-18 DIAGNOSIS — Z3480 Encounter for supervision of other normal pregnancy, unspecified trimester: Secondary | ICD-10-CM

## 2022-08-18 HISTORY — PX: TUBAL LIGATION: SHX77

## 2022-08-18 LAB — RPR: RPR Ser Ql: NONREACTIVE

## 2022-08-18 LAB — CBC
HCT: 27.8 % — ABNORMAL LOW (ref 36.0–46.0)
Hemoglobin: 9.3 g/dL — ABNORMAL LOW (ref 12.0–15.0)
MCH: 30.3 pg (ref 26.0–34.0)
MCHC: 33.5 g/dL (ref 30.0–36.0)
MCV: 90.6 fL (ref 80.0–100.0)
Platelets: 161 10*3/uL (ref 150–400)
RBC: 3.07 MIL/uL — ABNORMAL LOW (ref 3.87–5.11)
RDW: 13 % (ref 11.5–15.5)
WBC: 5.7 10*3/uL (ref 4.0–10.5)
nRBC: 0 % (ref 0.0–0.2)

## 2022-08-18 LAB — COMPREHENSIVE METABOLIC PANEL
ALT: 7 U/L (ref 0–44)
AST: 24 U/L (ref 15–41)
Albumin: 2.4 g/dL — ABNORMAL LOW (ref 3.5–5.0)
Alkaline Phosphatase: 144 U/L — ABNORMAL HIGH (ref 38–126)
Anion gap: 8 (ref 5–15)
BUN: 5 mg/dL — ABNORMAL LOW (ref 6–20)
CO2: 19 mmol/L — ABNORMAL LOW (ref 22–32)
Calcium: 8.2 mg/dL — ABNORMAL LOW (ref 8.9–10.3)
Chloride: 110 mmol/L (ref 98–111)
Creatinine, Ser: 0.56 mg/dL (ref 0.44–1.00)
GFR, Estimated: >60 mL/min (ref >60)
Glucose, Bld: 85 mg/dL (ref 70–99)
Potassium: 3.4 mmol/L — ABNORMAL LOW (ref 3.5–5.1)
Sodium: 137 mmol/L (ref 135–145)
Total Bilirubin: 0.4 mg/dL (ref 0.3–1.2)
Total Protein: 5.3 g/dL — ABNORMAL LOW (ref 6.5–8.1)

## 2022-08-18 SURGERY — LIGATION, FALLOPIAN TUBE, POSTPARTUM
Anesthesia: General | Site: Abdomen | Laterality: Bilateral

## 2022-08-18 MED ORDER — MIDAZOLAM HCL 2 MG/2ML IJ SOLN
INTRAMUSCULAR | Status: DC | PRN
  Administered 2022-08-18: 2 mg via INTRAVENOUS

## 2022-08-18 MED ORDER — ROCURONIUM BROMIDE 100 MG/10ML IV SOLN
INTRAVENOUS | Status: DC | PRN
  Administered 2022-08-18 (×2): 10 mg via INTRAVENOUS

## 2022-08-18 MED ORDER — OXYCODONE HCL 5 MG/5ML PO SOLN: 5.0000 mg | Freq: Once | ORAL | Status: AC | PRN

## 2022-08-18 MED ORDER — PROPOFOL 10 MG/ML IV BOLUS
INTRAVENOUS | Status: DC | PRN
  Administered 2022-08-18: 130 mg via INTRAVENOUS

## 2022-08-18 MED ORDER — DEXMEDETOMIDINE HCL IN NACL 80 MCG/20ML IV SOLN
INTRAVENOUS | Status: DC | PRN
  Administered 2022-08-18 (×3): 4 ug via INTRAVENOUS

## 2022-08-18 MED ORDER — MIDAZOLAM HCL 2 MG/2ML IJ SOLN
INTRAMUSCULAR | Status: AC
  Filled 2022-08-18: qty 2

## 2022-08-18 MED ORDER — NIFEDIPINE ER OSMOTIC RELEASE 30 MG PO TB24
30.0000 mg | ORAL_TABLET | Freq: Every day | ORAL | Status: DC
  Administered 2022-08-18: 30 mg via ORAL
  Filled 2022-08-18: qty 1

## 2022-08-18 MED ORDER — BUPIVACAINE HCL 0.5 % IJ SOLN
INTRAMUSCULAR | Status: DC | PRN
  Administered 2022-08-18: 2 mL

## 2022-08-18 MED ORDER — FENTANYL CITRATE (PF) 100 MCG/2ML IJ SOLN
INTRAMUSCULAR | Status: AC
  Filled 2022-08-18: qty 2

## 2022-08-18 MED ORDER — SUCCINYLCHOLINE CHLORIDE 200 MG/10ML IV SOSY
PREFILLED_SYRINGE | INTRAVENOUS | Status: DC | PRN
  Administered 2022-08-18: 100 mg via INTRAVENOUS

## 2022-08-18 MED ORDER — OXYCODONE HCL 5 MG PO TABS: 5.0000 mg | ORAL_TABLET | Freq: Once | ORAL | Status: AC | PRN

## 2022-08-18 MED ORDER — FENTANYL CITRATE (PF) 100 MCG/2ML IJ SOLN
INTRAMUSCULAR | Status: DC | PRN
  Administered 2022-08-18 (×2): 50 ug via INTRAVENOUS

## 2022-08-18 MED ORDER — ONDANSETRON HCL 4 MG/2ML IJ SOLN
INTRAMUSCULAR | Status: DC | PRN
  Administered 2022-08-18: 4 mg via INTRAVENOUS

## 2022-08-18 MED ORDER — SUGAMMADEX SODIUM 200 MG/2ML IV SOLN
INTRAVENOUS | Status: DC | PRN
  Administered 2022-08-18: 200 mg via INTRAVENOUS

## 2022-08-18 MED ORDER — DEXAMETHASONE SODIUM PHOSPHATE 10 MG/ML IJ SOLN
INTRAMUSCULAR | Status: DC | PRN
  Administered 2022-08-18: 10 mg via INTRAVENOUS

## 2022-08-18 MED ORDER — ACETAMINOPHEN 10 MG/ML IV SOLN
INTRAVENOUS | Status: AC
  Filled 2022-08-18: qty 100

## 2022-08-18 MED ORDER — LACTATED RINGERS IV SOLN: INTRAVENOUS | Status: DC | PRN

## 2022-08-18 MED ORDER — GLYCOPYRROLATE 0.2 MG/ML IJ SOLN
INTRAMUSCULAR | Status: DC | PRN
  Administered 2022-08-18: .2 mg via INTRAVENOUS

## 2022-08-18 MED ORDER — 0.9 % SODIUM CHLORIDE (POUR BTL) OPTIME
TOPICAL | Status: DC | PRN
  Administered 2022-08-18: 500 mL

## 2022-08-18 MED ORDER — LIDOCAINE HCL (CARDIAC) PF 100 MG/5ML IV SOSY
PREFILLED_SYRINGE | INTRAVENOUS | Status: DC | PRN
  Administered 2022-08-18: 80 mg via INTRAVENOUS

## 2022-08-18 MED ORDER — BUPIVACAINE HCL (PF) 0.5 % IJ SOLN
INTRAMUSCULAR | Status: AC
  Filled 2022-08-18: qty 30

## 2022-08-18 MED ORDER — KETOROLAC TROMETHAMINE 30 MG/ML IJ SOLN
INTRAMUSCULAR | Status: AC
  Filled 2022-08-18: qty 1

## 2022-08-18 MED ORDER — MENTHOL 3 MG MT LOZG
1.0000 | LOZENGE | OROMUCOSAL | Status: DC | PRN
  Administered 2022-08-18 – 2022-08-19 (×2): 3 mg via ORAL
  Filled 2022-08-18: qty 9

## 2022-08-18 MED ORDER — ACETAMINOPHEN 10 MG/ML IV SOLN
INTRAVENOUS | Status: DC | PRN
  Administered 2022-08-18: 1000 mg via INTRAVENOUS

## 2022-08-18 MED ORDER — FENTANYL CITRATE (PF) 100 MCG/2ML IJ SOLN
INTRAMUSCULAR | Status: AC
  Administered 2022-08-18: 25 ug via INTRAVENOUS
  Filled 2022-08-18: qty 2

## 2022-08-18 MED ORDER — GLYCOPYRROLATE 0.2 MG/ML IJ SOLN
INTRAMUSCULAR | Status: AC
  Filled 2022-08-18: qty 1

## 2022-08-18 MED ORDER — PROPOFOL 10 MG/ML IV BOLUS
INTRAVENOUS | Status: AC
  Filled 2022-08-18: qty 20

## 2022-08-18 MED ORDER — NIFEDIPINE ER OSMOTIC RELEASE 30 MG PO TB24
60.0000 mg | ORAL_TABLET | Freq: Every day | ORAL | Status: DC
  Administered 2022-08-19: 60 mg via ORAL
  Filled 2022-08-18: qty 2

## 2022-08-18 MED ORDER — FENTANYL CITRATE (PF) 100 MCG/2ML IJ SOLN
25.0000 ug | INTRAMUSCULAR | Status: DC | PRN
  Administered 2022-08-18: 25 ug via INTRAVENOUS

## 2022-08-18 MED ORDER — OXYCODONE HCL 5 MG PO TABS
ORAL_TABLET | ORAL | Status: AC
  Administered 2022-08-18: 5 mg via ORAL
  Filled 2022-08-18: qty 1

## 2022-08-18 MED ORDER — KETOROLAC TROMETHAMINE 30 MG/ML IJ SOLN
INTRAMUSCULAR | Status: DC | PRN
  Administered 2022-08-18: 30 mg via INTRAVENOUS

## 2022-08-18 SURGICAL SUPPLY — 36 items
APPLICATOR COTTON TIP 6 STRL (MISCELLANEOUS) ×1 IMPLANT
APPLICATOR COTTON TIP 6IN STRL (MISCELLANEOUS) IMPLANT
BLADE SURG SZ11 CARB STEEL (BLADE) ×1 IMPLANT
CHLORAPREP W/TINT 26 (MISCELLANEOUS) ×1 IMPLANT
DERMABOND ADVANCED .7 DNX12 (GAUZE/BANDAGES/DRESSINGS) ×1 IMPLANT
DRAPE LAPAROTOMY 77X122 PED (DRAPES) ×1 IMPLANT
DRSG TEGADERM 2-3/8X2-3/4 SM (GAUZE/BANDAGES/DRESSINGS) IMPLANT
DRSG TEGADERM 4X4.75 (GAUZE/BANDAGES/DRESSINGS) IMPLANT
ELECT CAUTERY BLADE 6.4 (BLADE) ×1 IMPLANT
ELECT REM PT RETURN 9FT ADLT (ELECTROSURGICAL) ×1
ELECTRODE REM PT RTRN 9FT ADLT (ELECTROSURGICAL) ×1 IMPLANT
GAUZE 4X4 16PLY ~~LOC~~+RFID DBL (SPONGE) ×1 IMPLANT
GLOVE SURG SYN 8.0 (GLOVE) ×1 IMPLANT
GLOVE SURG SYN 8.0 PF PI (GLOVE) ×1 IMPLANT
GOWN STRL REUS W/ TWL LRG LVL3 (GOWN DISPOSABLE) ×1 IMPLANT
GOWN STRL REUS W/ TWL XL LVL3 (GOWN DISPOSABLE) ×1 IMPLANT
GOWN STRL REUS W/TWL LRG LVL3 (GOWN DISPOSABLE) ×1
GOWN STRL REUS W/TWL XL LVL3 (GOWN DISPOSABLE) ×1
KIT TURNOVER KIT A (KITS) ×1 IMPLANT
LABEL OR SOLS (LABEL) ×1 IMPLANT
MANIFOLD NEPTUNE II (INSTRUMENTS) ×1 IMPLANT
NEEDLE HYPO 22GX1.5 SAFETY (NEEDLE) ×1 IMPLANT
NS IRRIG 500ML POUR BTL (IV SOLUTION) ×1 IMPLANT
PACK BASIN MINOR ARMC (MISCELLANEOUS) ×1 IMPLANT
SCRUB CHG 4% DYNA-HEX 4OZ (MISCELLANEOUS) ×1 IMPLANT
SLEEVE SCD COMPRESS KNEE MED (STOCKING) IMPLANT
SPONGE GAUZE 2X2 8PLY STRL LF (GAUZE/BANDAGES/DRESSINGS) ×1 IMPLANT
STRIP CLOSURE SKIN 1/4X4 (GAUZE/BANDAGES/DRESSINGS) IMPLANT
SUT PLAIN GUT 0 (SUTURE) ×2 IMPLANT
SUT VIC AB 2-0 UR6 27 (SUTURE) ×1 IMPLANT
SUT VIC AB 4-0 SH 27 (SUTURE) ×1
SUT VIC AB 4-0 SH 27XANBCTRL (SUTURE) ×1 IMPLANT
SWABSTK COMLB BENZOIN TINCTURE (MISCELLANEOUS) IMPLANT
SYR 10ML LL (SYRINGE) ×1 IMPLANT
TRAP FLUID SMOKE EVACUATOR (MISCELLANEOUS) ×1 IMPLANT
WATER STERILE IRR 500ML POUR (IV SOLUTION) ×1 IMPLANT

## 2022-08-18 NOTE — Anesthesia Preprocedure Evaluation (Signed)
Anesthesia Evaluation  Patient identified by MRN, date of birth, ID band Patient awake    Reviewed: Allergy & Precautions, NPO status , Patient's Chart, lab work & pertinent test results  History of Anesthesia Complications Negative for: history of anesthetic complications  Airway Mallampati: III  TM Distance: >3 FB Neck ROM: full    Dental  (+) Chipped, Poor Dentition, Missing   Pulmonary neg shortness of breath, former smoker   Pulmonary exam normal        Cardiovascular Exercise Tolerance: Good hypertension, (-) angina (-) Past MI and (-) DOE Normal cardiovascular exam     Neuro/Psych  Headaches  negative psych ROS   GI/Hepatic negative GI ROS, Neg liver ROS,neg GERD  ,,  Endo/Other  negative endocrine ROS    Renal/GU      Musculoskeletal   Abdominal   Peds  Hematology negative hematology ROS (+)   Anesthesia Other Findings Past Medical History: No date: Anemia 08/17/2022: Encounter for planned induction of labor No date: Headache No date: Pregnancy induced hypertension  Past Surgical History: 12/04/2015: MASS EXCISION; Right     Comment:  Procedure: RIGHT HAND EXCISION  CARPAL BOS;  Surgeon:               Leanor Kail, MD;  Location: [facility];                Service: Orthopedics;  Laterality: Right; No date: NO PAST SURGERIES  BMI    Body Mass Index: 29.76 kg/m      Reproductive/Obstetrics negative OB ROS                             Anesthesia Physical Anesthesia Plan  ASA: 2  Anesthesia Plan: General ETT   Post-op Pain Management:    Induction: Intravenous  PONV Risk Score and Plan: Ondansetron, Dexamethasone, Midazolam and Treatment may vary due to age or medical condition  Airway Management Planned: Oral ETT  Additional Equipment:   Intra-op Plan:   Post-operative Plan: Extubation in OR  Informed Consent: I have reviewed the patients History  and Physical, chart, labs and discussed the procedure including the risks, benefits and alternatives for the proposed anesthesia with the patient or authorized representative who has indicated his/her understanding and acceptance.     Dental Advisory Given  Plan Discussed with: Anesthesiologist, CRNA and Surgeon  Anesthesia Plan Comments: (Patient consented for risks of anesthesia including but not limited to:  - adverse reactions to medications - damage to eyes, teeth, lips or other oral mucosa - nerve damage due to positioning  - sore throat or hoarseness - Damage to heart, brain, nerves, lungs, other parts of body or loss of life  Patient voiced understanding.)       Anesthesia Quick Evaluation

## 2022-08-18 NOTE — Anesthesia Procedure Notes (Signed)
Procedure Name: Intubation Date/Time: 08/18/2022 7:51 AM  Performed by: Lily Peer, Terasa Orsini, CRNAPre-anesthesia Checklist: Patient identified, Emergency Drugs available, Suction available and Patient being monitored Patient Re-evaluated:Patient Re-evaluated prior to induction Oxygen Delivery Method: Circle system utilized Preoxygenation: Pre-oxygenation with 100% oxygen Induction Type: IV induction and Rapid sequence Laryngoscope Size: McGraph and 3 Grade View: Grade I Tube type: Oral Tube size: 6.5 mm Number of attempts: 1 Airway Equipment and Method: Stylet Placement Confirmation: ETT inserted through vocal cords under direct vision, positive ETCO2 and breath sounds checked- equal and bilateral Secured at: 19 cm Tube secured with: Tape Dental Injury: Teeth and Oropharynx as per pre-operative assessment

## 2022-08-18 NOTE — Anesthesia Postprocedure Evaluation (Signed)
Anesthesia Post Note  Patient: Carly Stewart  Procedure(s) Performed: POST PARTUM TUBAL LIGATION (Bilateral: Abdomen)  Patient location during evaluation: PACU Anesthesia Type: General Level of consciousness: awake and alert Pain management: pain level controlled Vital Signs Assessment: post-procedure vital signs reviewed and stable Respiratory status: spontaneous breathing, nonlabored ventilation, respiratory function stable and patient connected to nasal cannula oxygen Cardiovascular status: blood pressure returned to baseline and stable Postop Assessment: no apparent nausea or vomiting Anesthetic complications: no   No notable events documented.   Last Vitals:  Vitals:   08/18/22 0900 08/18/22 0916  BP: (!) 160/97 (!) 164/94  Pulse: 84 76  Resp: 10 12  Temp:    SpO2: 100% 98%    Last Pain:  Vitals:   08/18/22 0900  TempSrc:   PainSc: 7                  Precious Haws Terrence Pizana

## 2022-08-18 NOTE — Progress Notes (Signed)
Post Partum Day 1 Subjective: Doing well, no complaints.  Tolerating regular diet, pain with PO meds, voiding and ambulating without difficulty.  No CP SOB Fever,Chills, N/V or leg pain; denies nipple or breast pain, no HA change of vision, RUQ/epigastric pain  Objective: BP (!) 149/93 (BP Location: Left Arm) Comment: Beryle Quant, RN of bp  Pulse 69   Temp 97.6 F (36.4 C) (Oral)   Resp 18   Ht 5\' 3"  (1.6 m)   Wt 76.2 kg   LMP 11/11/2021 (Approximate) Comment: delivery, 08/17/22  SpO2 99%   Breastfeeding No   BMI 29.76 kg/m    Vitals:   08/17/22 1919 08/17/22 2235 08/17/22 2356 08/18/22 0312  BP: (!) 148/79 (!) 157/87 136/80 133/69   08/18/22 0656 08/18/22 0839 08/18/22 0845 08/18/22 0900  BP: (!) 148/89 (!) 157/94 (!) 147/93 (!) 160/97   08/18/22 0916 08/18/22 0934 08/18/22 1000 08/18/22 1040  BP: (!) 164/94 (!) 165/97 135/87 (!) 149/93    Physical Exam:  General: NAD Breasts: soft/nontender CV: RRR Pulm: nl effort, CTABL Abdomen: soft, NT, BS x 4 Incision: Dsg CDI/no erythema or drainage Perineum: minimal edema, intact Lochia: moderate Uterine Fundus: fundus firm and 1 fb below umbilicus DVT Evaluation: no cords, ttp LEs   Recent Labs    08/17/22 1036 08/18/22 0531  HGB 12.1 9.3*  HCT 35.4* 27.8*  WBC 7.4 5.7  PLT 201 161    Assessment/Plan: 37 y.o. [id number] postpartum day # 1  - Continue routine PP care - Lactation consult: n/a, not planning on breastfeeding - Postpartum contraception: BTL today - Acute blood loss anemia - hemodynamically stable and asymptomatic; start po ferrous sulfate BID with stool softeners  - Immunization status: all Imms up to date, declined flu vaccine - Elevated BP: P/C ratio 0.35, currently taking Labetalol 200mg  BID. Discussed with Dr. Ouida Sills and will add-in Procardia 30mg  XL. Continue monitoring BP.   Disposition: Does not desire Dc home today.   Gertie Fey, CNM 08/18/2022 11:46 AM

## 2022-08-18 NOTE — Progress Notes (Signed)
PPD1 pt reconfirms desire for PP btl  Labs reviewed . All questions answered . Proceed

## 2022-08-18 NOTE — Brief Op Note (Signed)
08/18/2022  8:31 AM  PATIENT:  Carly Stewart  37 y.o. female  PRE-OPERATIVE DIAGNOSIS:  desires permanant sterilization  POST-OPERATIVE DIAGNOSIS:  desires permanant sterilization  PROCEDURE:  Procedure(s): POST PARTUM TUBAL LIGATION (Bilateral)  SURGEON:  Surgeon(s) and Role:    * Kalem Rockwell, Gwen Her, MD - Primary  PHYSICIAN ASSISTANT:   ASSISTANTS: none   ANESTHESIA:   general  EBL:  5 mL  IOF 600 ccc  BLOOD ADMINISTERED:none  DRAINS: none   LOCAL MEDICATIONS USED:  MARCAINE     SPECIMEN:  Source of Specimen:  portion right and left tube   DISPOSITION OF SPECIMEN:  PATHOLOGY  COUNTS:  YES  TOURNIQUET:  * No tourniquets in log *  DICTATION: .Other Dictation: Dictation Number verbal  PLAN OF CARE:  continue inpt   PATIENT DISPOSITION:  PACU - hemodynamically stable.   Delay start of Pharmacological VTE agent (>24hrs) due to surgical blood loss or risk of bleeding: not applicable

## 2022-08-18 NOTE — Op Note (Signed)
NAME: Carly Stewart, Carly Stewart MEDICAL RECORD NO: [id number] ACCOUNT NO: [id number] DATE OF BIRTH: 1985-12-07 FACILITY: [facility] LOCATION: [facility] PHYSICIAN: Boykin Nearing, MD  Operative Report   DATE OF PROCEDURE: 08/18/2022  PREOPERATIVE DIAGNOSIS:  Elective permanent sterilization.  POSTOPERATIVE DIAGNOSIS:  Elective permanent sterilization.  PROCEDURE:  Bilateral tubal ligation [place].  SURGEON:  Boykin Nearing, MD  ANESTHESIA:  General endotracheal anesthesia.  INDICATIONS:  37 year old gravida 5, now para 4, status post spontaneous vaginal delivery, the day before, which was uncomplicated.  The patient confirmed her desire for permanent sterilization and Medicaid consent form has been signed greater than 30  days.  DESCRIPTION OF PROCEDURE:  After adequate general endotracheal anesthesia, the patient was placed in dorsal supine position.  The patient's abdomen was prepped and draped in normal sterile fashion.  Timeout was performed.  Infraumbilical incision was  made approximately 15 mm after injecting with 0.5% Marcaine.  The fascia was identified and opened sharply and the peritoneum was opened sharply as well.  Attention was directed to the patient's right side where the right fallopian tube was identified  and grasped with a Babcock clamp and the fimbriated end was visualized.  Two separate 0 plain gut sutures were applied at the midportion of the fallopian tube and a 1.5 cm portion of fallopian tube was removed.  Good hemostasis was noted.  Similar  procedure was repeated on the patient's left fallopian tube after identifying the fallopian tube and the fimbriated end, 2 separate 0 plain gut sutures were applied in the mid portion of the fallopian tube.  A 1.5 cm portion of fallopian tube was  removed.  Good hemostasis noted.  The fascia was then closed with a 2-0 Vicryl suture and the skin was reapproximated with interrupted 4-0 Vicryl suture and Dermabond was  applied.  There were no complications.  ESTIMATED BLOOD LOSS:  5 mL.  INTRAOPERATIVE FLUIDS:  600 mL.  The patient did receive 30 mg of intravenous Toradol at the end of the procedure.  The patient tolerated the procedure well and was taken to recovery room in good condition.   PUS D: 08/18/2022 8:44:36 am T: 08/18/2022 8:54:00 am  JOB: [id number]/ [id number]

## 2022-08-18 NOTE — Transfer of Care (Signed)
Immediate Anesthesia Transfer of Care Note  Patient: Carly Stewart  Procedure(s) Performed: POST PARTUM TUBAL LIGATION (Bilateral: Abdomen)  Patient Location: PACU  Anesthesia Type:General  Level of Consciousness: awake, alert , and oriented  Airway & Oxygen Therapy: Patient Spontanous Breathing and Patient connected to face mask oxygen  Post-op Assessment: Report given to RN and Post -op Vital signs reviewed and stable  Post vital signs: Reviewed and stable  Last Vitals:  Vitals Value Taken Time  BP 157/94   Temp    Pulse 96 08/18/22 0839  Resp 18 08/18/22 0839  SpO2 100 % 08/18/22 0839  Vitals shown include unvalidated device data.  Last Pain:  Vitals:   08/18/22 0656  TempSrc: Tympanic  PainSc: 0-No pain      Patients Stated Pain Goal: 0 (31/51/76 1607)  Complications: No notable events documented.

## 2022-08-19 LAB — BPAM RBC
Blood Product Expiration Date: 202402122359
Blood Product Expiration Date: 202402122359
Unit Type and Rh: 6200
Unit Type and Rh: 6200

## 2022-08-19 LAB — SURGICAL PATHOLOGY

## 2022-08-19 LAB — TYPE AND SCREEN
ABO/RH(D): AB POS
Antibody Screen: POSITIVE
Unit division: 0
Unit division: 0

## 2022-08-19 MED ORDER — OXYCODONE HCL 5 MG PO TABS: 5.0000 mg | ORAL_TABLET | Freq: Three times a day (TID) | ORAL | 0 refills | Status: AC | PRN

## 2022-08-19 MED ORDER — IBUPROFEN 600 MG PO TABS: 600.0000 mg | ORAL_TABLET | Freq: Four times a day (QID) | ORAL | Status: AC | PRN

## 2022-08-19 MED ORDER — NIFEDIPINE ER OSMOTIC RELEASE 90 MG PO TB24: 90.0000 mg | ORAL_TABLET | Freq: Every day | ORAL | 6 refills | Status: AC

## 2022-08-19 MED ORDER — LABETALOL HCL 200 MG PO TABS: 400.0000 mg | ORAL_TABLET | Freq: Two times a day (BID) | ORAL | 0 refills | Status: AC

## 2022-08-19 MED ORDER — FERROUS SULFATE 325 (65 FE) MG PO TABS: 325.0000 mg | ORAL_TABLET | Freq: Two times a day (BID) | ORAL | Status: AC

## 2022-08-19 MED ORDER — ACETAMINOPHEN 325 MG PO TABS: 650.0000 mg | ORAL_TABLET | Freq: Four times a day (QID) | ORAL | Status: AC | PRN

## 2022-08-19 NOTE — Progress Notes (Signed)
Post Partum Day 2 Subjective: Doing well, no complaints.  Tolerating regular diet, pain with PO meds, voiding and ambulating without difficulty.  No CP SOB Fever,Chills, N/V or leg pain; denies nipple or breast pain, no HA change of vision, RUQ/epigastric pain  Objective: BP (!) 148/83 (BP Location: Right Arm)   Pulse 70   Temp 98.4 F (36.9 C) (Oral)   Resp 18   Ht 5\' 3"  (1.6 m)   Wt 76.2 kg   LMP 11/11/2021 (Approximate) Comment: delivery, 08/17/22  SpO2 99%   Breastfeeding No   BMI 29.76 kg/m    Vitals:   08/18/22 0839 08/18/22 0845 08/18/22 0900 08/18/22 0916  BP: (!) 157/94 (!) 147/93 (!) 160/97 (!) 164/94   08/18/22 0934 08/18/22 1000 08/18/22 1040 08/18/22 1557  BP: (!) 165/97 135/87 (!) 149/93 (!) 143/80   08/18/22 2010 08/18/22 2304 08/19/22 0325 08/19/22 0744  BP: (!) 151/85 (!) 148/83 (!) 142/80 (!) 148/83    Physical Exam:  General: NAD Breasts: soft/nontender CV: RRR Pulm: nl effort,  Abdomen: soft, NT Incision: ([place]) Dsg CDI/no erythema or drainage, BTL incision intact without drainage Perineum: minimal edema, intact Lochia: moderate Uterine Fundus: fundus firm and 1 fb below umbilicus DVT Evaluation: no cords, ttp LEs   Recent Labs    08/17/22 1036 08/18/22 0531  HGB 12.1 9.3*  HCT 35.4* 27.8*  WBC 7.4 5.7  PLT 201 161     Assessment/Plan: 37 y.o. Q2V9563 postpartum day # 2  gHTN P/C ratio 0.35 Labetalol 200mg  BID Procardia 30mg  XL increase to 60mg  this am Vitals:   08/18/22 0839 08/18/22 0845 08/18/22 0900 08/18/22 0916  BP: (!) 157/94 (!) 147/93 (!) 160/97 (!) 164/94   08/18/22 0934 08/18/22 1000 08/18/22 1040 08/18/22 1557  BP: (!) 165/97 135/87 (!) 149/93 (!) 143/80   08/18/22 2010 08/18/22 2304 08/19/22 0325 08/19/22 0744  BP: (!) 151/85 (!) 148/83 (!) 142/80 (!) 148/83      - Continue routine PP care - Lactation consult: n/a, not planning on breastfeeding - Postpartum contraception: BTL yesterday - Acute blood loss  anemia - hemodynamically stable and asymptomatic; start po ferrous sulfate BID with stool softeners  - Immunization status: all Imms up to date, declined flu vaccine   Disposition: Does desire Dc home today.   Clydene Laming, CNM 08/19/2022 8:46 AM

## 2022-08-19 NOTE — Progress Notes (Signed)
Patient discharged home with infant. Discharge instructions and prescriptions given and reviewed with patient. Patient verbalized understanding. Escorted out by staff.

## 2022-08-19 NOTE — Discharge Instructions (Signed)
Discharge Instructions:   If there are any new medications, they have been ordered and will be available for pickup at the listed pharmacy on your way home from the hospital.   Call office if you have any of the following: headache, visual changes, fever >101.0 F, chills, shortness of breath, breast concerns, excessive vaginal bleeding, incision drainage or problems, leg pain or redness, depression or any other concerns. If you have vaginal discharge with an odor, let your doctor know.   It is normal to bleed for up to 6 weeks. You should not soak through more than 1 pad in 1 hour. If you have a blood clot larger than your fist with continued bleeding, call your doctor.   Activity: Do not lift > 10 lbs for 6 weeks (do not lift anything heavier than your baby). No intercourse, tampons, swimming pools, hot tubs, baths (only showers) for 6 weeks.  No driving for 1-2 weeks. Continue prenatal vitamin, especially if breastfeeding. Increase calories and fluids (water) while breastfeeding.   Your milk will come in, in the next couple of days (right now it is colostrum). You may have a slight fever when your milk comes in, but it should go away on its own.  If it does not, and rises above 101 F please call the doctor. You will also feel achy and your breasts will be firm. They will also start to leak. If you are breastfeeding, continue as you have been and you can pump/express milk for comfort.   If you have too much milk, your breasts can become engorged, which could lead to mastitis. This is an infection of the milk ducts. It can be very painful and you will need to notify your doctor to obtain a prescription for antibiotics. You can also treat it with a shower or hot/cold compress.   For concerns about your baby, please call your pediatrician.  For breastfeeding concerns, the lactation consultant can be reached at [phone].   Postpartum blues (feelings of happy one minute and sad another minute)  are normal for the first few weeks but if it gets worse let your doctor know.   Congratulations! We enjoyed caring for you and your new bundle of joy!

## 2022-08-23 ENCOUNTER — Telehealth: Payer: Self-pay

## 2022-08-23 NOTE — Telephone Encounter (Signed)
Missed appoint today for BP Check. Called patient and left message on voice mail to call [facility] at [phone] to reschedule appointment. BThiele RN

## 2022-08-24 ENCOUNTER — Ambulatory Visit: Payer: BC Managed Care – PPO | Admitting: Advanced Practice Midwife

## 2022-08-24 VITALS — BP 139/89 | HR 65 | Temp 97.2°F

## 2022-08-24 DIAGNOSIS — O09292 Supervision of pregnancy with other poor reproductive or obstetric history, second trimester: Secondary | ICD-10-CM

## 2022-08-24 DIAGNOSIS — Z9851 Tubal ligation status: Secondary | ICD-10-CM

## 2022-08-24 NOTE — Telephone Encounter (Signed)
Missed appointment 08/23/22 for PP BP Check. Called patient and left message on voice mail to call [facility] at [phone] to reschedule appointment.   Al Decant, RN

## 2022-08-24 NOTE — Progress Notes (Addendum)
BP 154/91, HR 68, Temp 97.2, weight 147.6 lbs. Repeat BP= 139/89. Patient states started Labetalol on Saturday and Nifedipine on Monday. Last dose taken was 0800 this morning. [facility] called for appointment-scheduled for 08/25/2022 at 0930. Patient given reminder card. Provider aware of urine dip results. Alesia Banda RN

## 2022-08-24 NOTE — Progress Notes (Signed)
37 yo SBF G5P4 here for pp BP check S:  IOL at 88 5/7 with SVD on 08/17/22 M 8#2 with BP=159/95. Pt had BTL 08/18/22 and placed on Labetalol 200 mg BID+Nifedipine 90 mg daily while in hospital and sent home with these. Last took each at 0800 this am. C/o LLQ pain since BTL for which she is taking 3 IB+650 mg Tylenol+oxycodone 5 mg q 6 hours for pain. Denies pp coitus. Has light rubra lochia O:  154/91, 68. Denies h/a, scotoma, epigastric pain. BP while in labor was 159/95.  U/a today with 3+ blood, neg proteinuria A/P:  apt made for pt to be seen at [facility] tomorrow at 0930.  Pt to be followed at [facility] for BP.

## 2022-08-25 ENCOUNTER — Telehealth: Payer: Self-pay

## 2022-08-25 LAB — URINALYSIS
Bilirubin, UA: NEGATIVE
Glucose, UA: NEGATIVE
Ketones, UA: NEGATIVE
Nitrite, UA: NEGATIVE
Protein,UA: NEGATIVE
Specific Gravity, UA: 1.01 (ref 1.005–1.030)
Urobilinogen, Ur: 0.2 mg/dL (ref 0.2–1.0)
pH, UA: 6 (ref 5.0–7.5)

## 2022-08-25 NOTE — Telephone Encounter (Signed)
Call received from [facility] to notify [facility] that [place] for elevated BP evaluation (referred from 08/24/2021 [facility] post-partum check BP check). Per Glenard Haring, she called the client and recorded message stated voicemail box was full. She then called emergency contact number [facility] has on file (female contact) and call was answered by female who stated Carly Stewart was there. Glenard Haring stated phone call was then disconnected. She called [facility] to notify us of above.  This RN called client and per recorded message, voicemail box is full. Call then made to only emergency contact [facility] has on file which is for mother. Phone number is not working (tried using clinic mobile and [employer]).  Ola Spurr CNM sent secure chat to read above. Rich Number, RN

## 2022-08-26 NOTE — Telephone Encounter (Signed)
Call to [facility] and spoke with Glenard Haring to ascertain if Carly Stewart had rescheduled her missed appt from 08/25/2022. Per Glenard Haring, no future appt scheduled for client.  Secure chat sent to R. Karrie Doffing MSW, [facility] Care Manager to ascertain if was able to do home visit yesterday to provide [name]. Rich Number, RN

## 2022-08-26 NOTE — Telephone Encounter (Signed)
Per [place], she found client at home yesterday and offered to call [facility] for her to schedule appt. Client declined offer and declined to call [facility] (has number). E. [place] notified of above. Ms. Carly Stewart to document regarding her home visit. Rich Number, RN

## 2022-09-07 NOTE — Addendum Note (Signed)
Addended by: Cletis Media on: 09/07/2022 03:25 PM   Modules accepted: Orders

## 2022-09-27 ENCOUNTER — Ambulatory Visit: Payer: BC Managed Care – PPO

## 2022-09-27 ENCOUNTER — Telehealth: Payer: Self-pay

## 2022-09-27 NOTE — Telephone Encounter (Signed)
Call to patient to rescheduled missed PP appointment today. PP appointment rescheduled for 10/14/22 at 3:30 arrival time.   Patient verbalized understanding.  Al Decant, RN

## 2022-10-13 ENCOUNTER — Telehealth: Payer: Self-pay

## 2022-10-13 NOTE — Telephone Encounter (Signed)
[facility]- Discharge Call [facility] with pt about the following below. 1-Do you have any questions or concerns about yourself as you heal?  Vag Del 2-Any concerns or questions about your baby?No 3-How was your stay at the hospital?Good 5-How did our team work together to care for you? You should be receiving a survey in the mail soon.   We would really appreciate it if you could fill that out for [place] and return it in the mail.  We value the feedback to make improvements and continue the great work we do.   If you have any questions please feel free to call me back at [phone]

## 2022-10-14 ENCOUNTER — Ambulatory Visit: Payer: BC Managed Care – PPO

## 2023-01-17 IMAGING — US US OB < 14 WEEKS - US OB TV
1 series · 14 of 28 positions shown · non-contrast
Comparison: No prior study available from the current gestation.

CLINICAL DATA: Abdominal pain, nausea, vomiting. Concern for
ectopic pregnancy

EXAM:
OBSTETRIC <14 WK US AND TRANSVAGINAL OB US
TECHNIQUE: Both transabdominal and transvaginal ultrasound examinations were
performed for complete evaluation of the gestation as well as the
maternal uterus, adnexal regions, and pelvic cul-de-sac.
Transvaginal technique was performed to assess early pregnancy.

[Series 1: us ob less than 14 weeks with ob transvaginal · 14 of 140 slices shown]
[im 6/140]
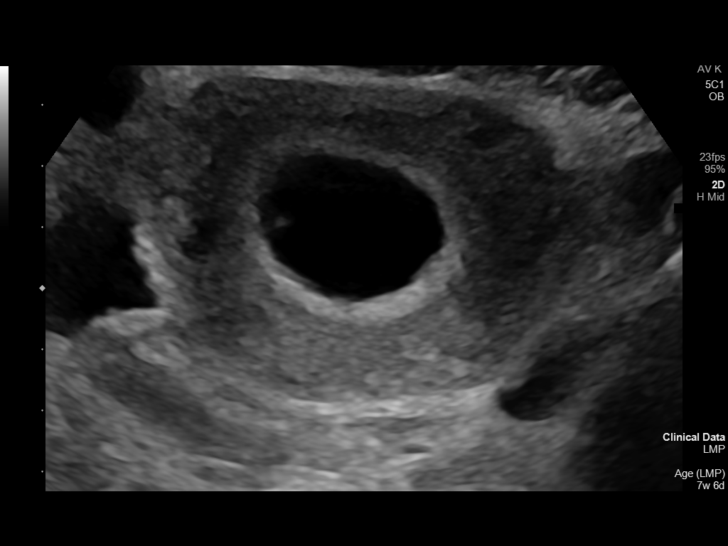
[im 16/140]
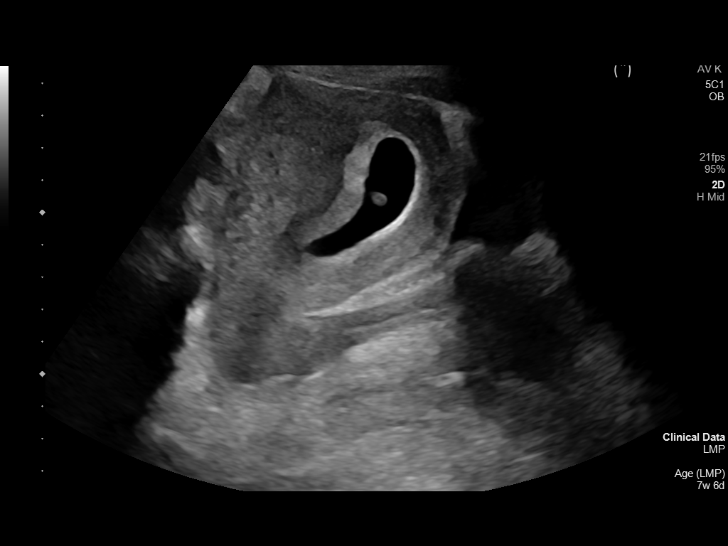
[im 26/140]
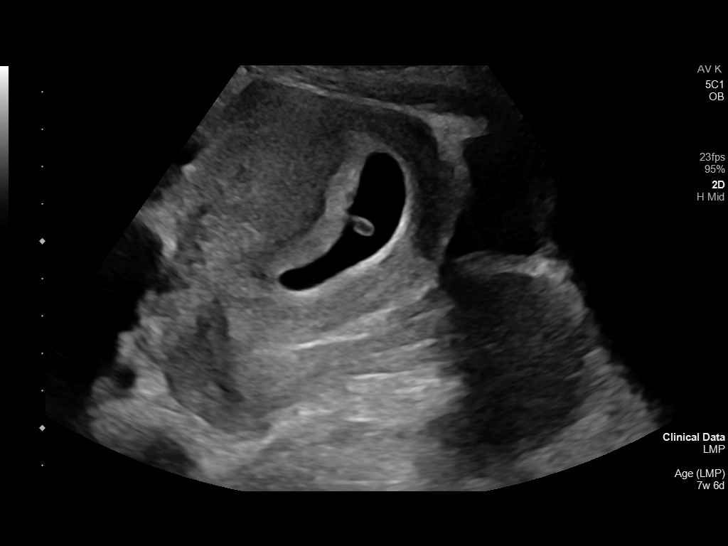
[im 37/140]
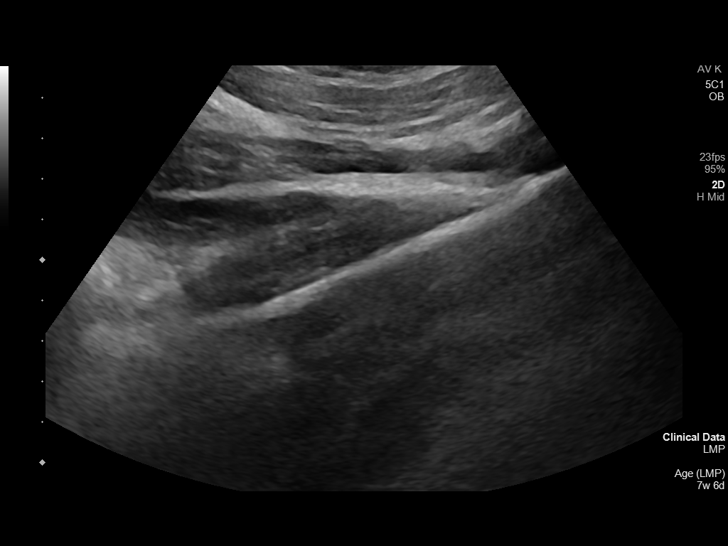
[im 47/140]
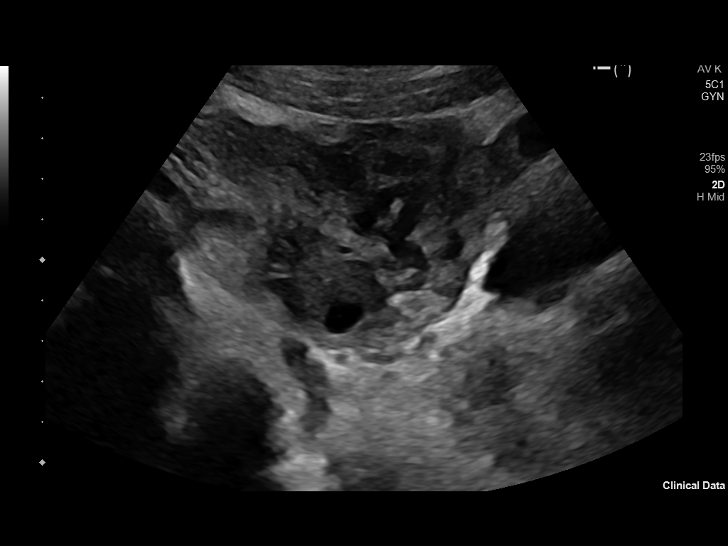
[im 57/140]
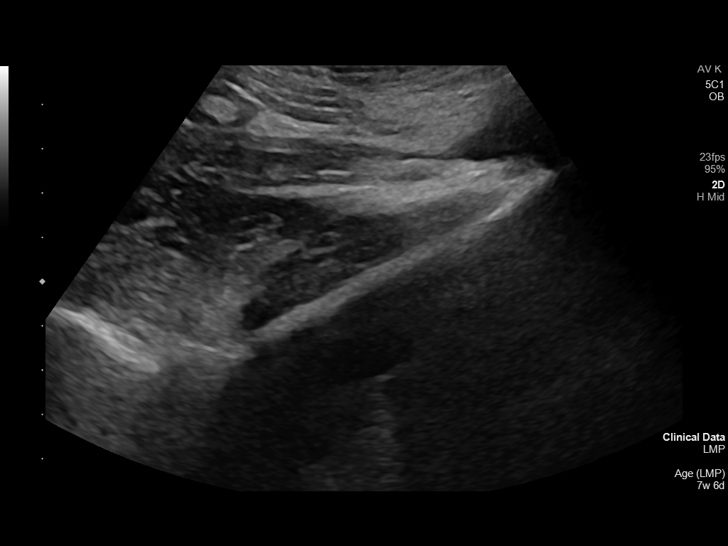
[im 67/140]
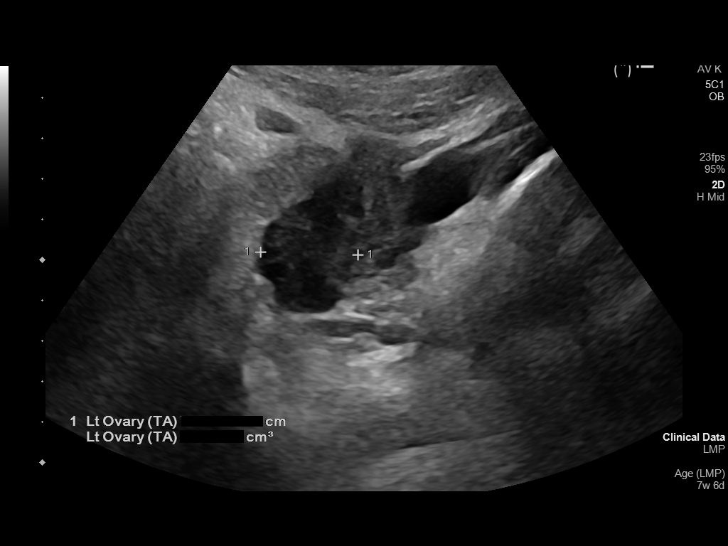
[im 78/140]
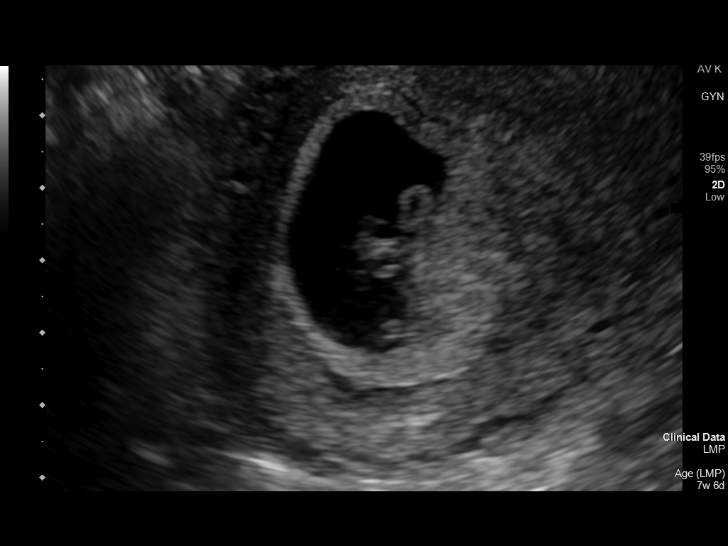
[im 88/140]
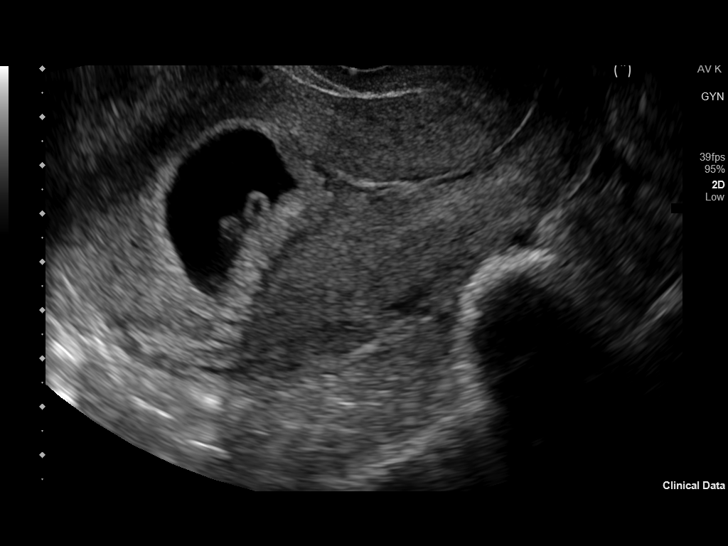
[im 98/140]
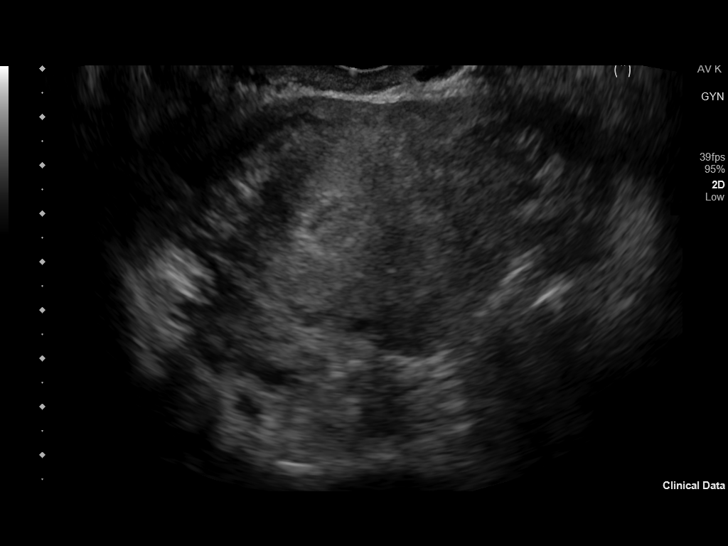
[im 109/140]
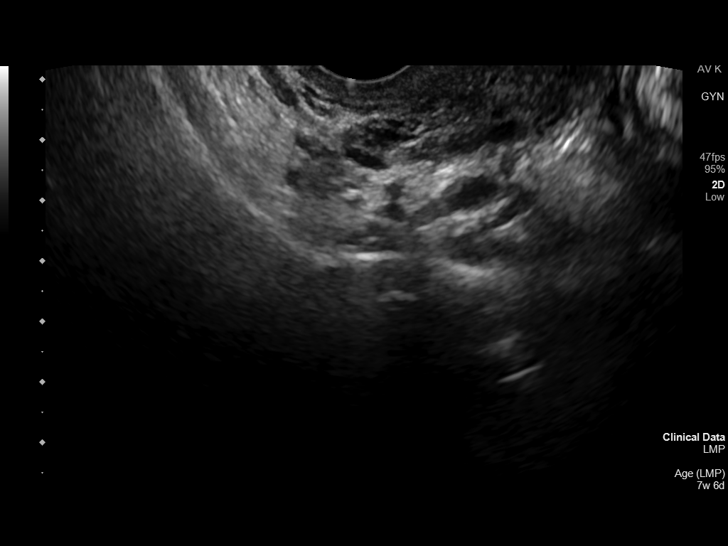
[im 119/140]
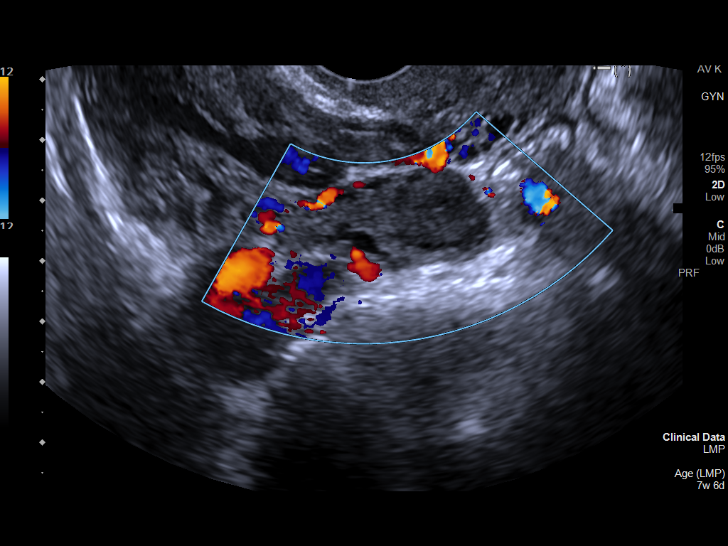
[im 129/140]
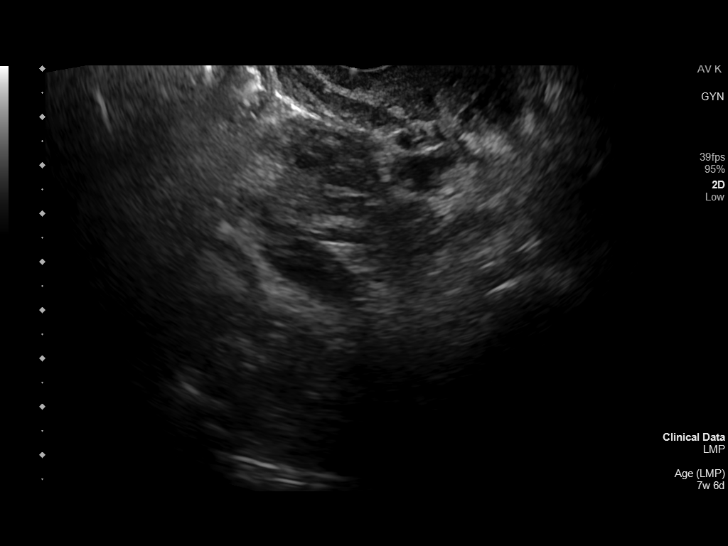
[im 140/140]
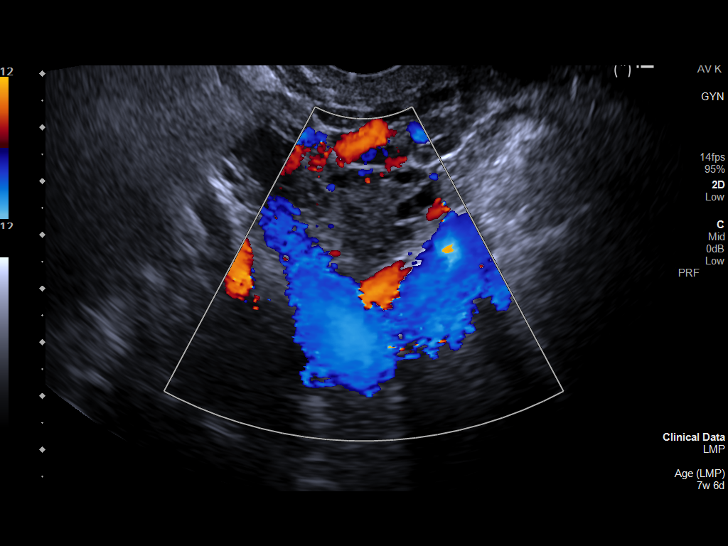

[14 of 28 positions shown; findings below may reference images not displayed]

FINDINGS: Intrauterine gestational sac: Single

Yolk sac:  Visualized.

Embryo:  Visualized.

Cardiac Activity: Visualized.

Heart Rate: 158 bpm

CRL: 14.5  Mm   7 w   5 d                  US EDC: 08/19/22

Subchorionic hemorrhage:  Small subchorionic hemorrhage is seen.

Maternal uterus/adnexae: The right ovary measures 3.1 cm x 2.2 cm x
2.5 cm. A corpus luteum cyst is noted measuring up to 1.9 cm. The
left ovary is normal measuring up to 2.5 cm x 2.5 cm x 2.4 cm. There
is no free fluid in the pelvis.
IMPRESSION: 1. Single live intrauterine pregnancy identified with an estimated
gestational age of 7 weeks 5 days by crown-rump length.
2. Small subchorionic hemorrhage.

## 2023-01-17 IMAGING — US US ABDOMEN COMPLETE
1 series · 14 of 25 positions shown · non-contrast
Comparison: None Available.

CLINICAL DATA: Right flank pain, nausea, vomiting.  Pregnant

EXAM:
ABDOMEN ULTRASOUND COMPLETE

[Series 1: us abdomen complete · 14 of 136 slices shown]
[im 1/136]
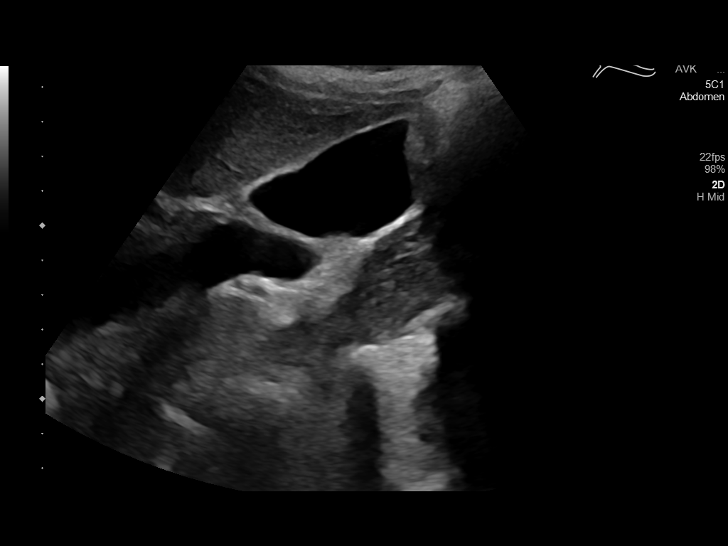
[im 12/136]
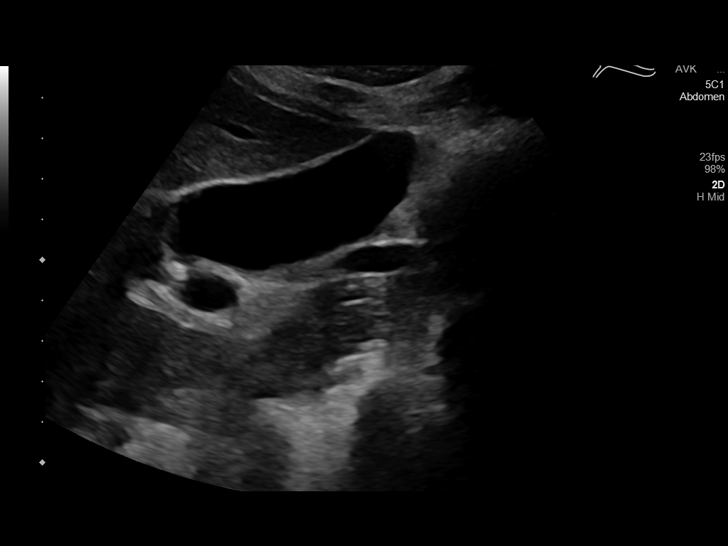
[im 23/136]
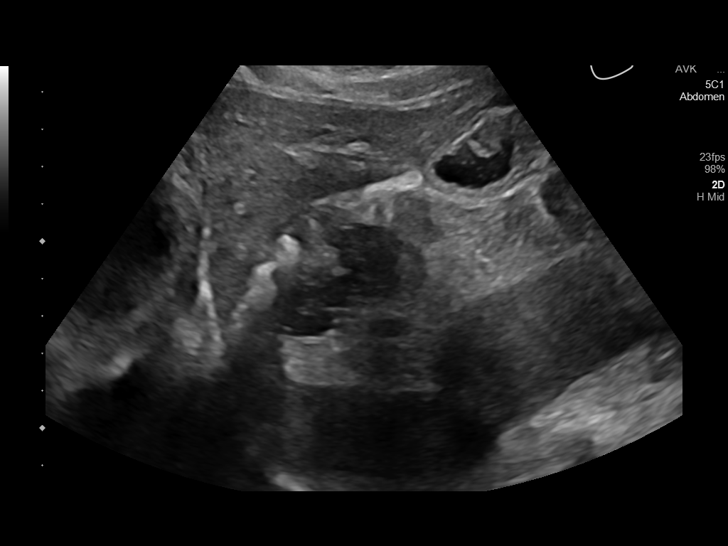
[im 34/136]
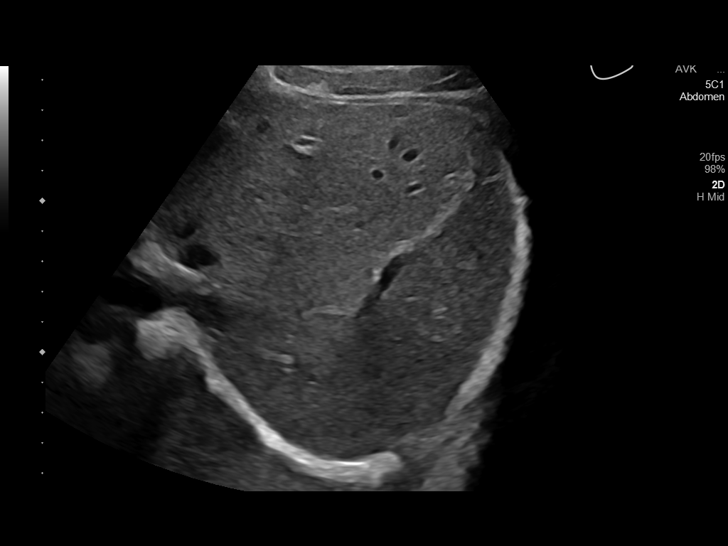
[im 46/136]
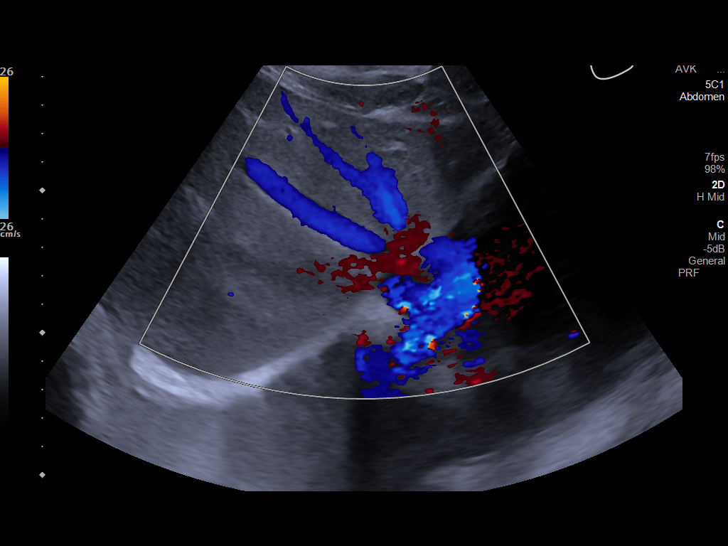
[im 51/136]
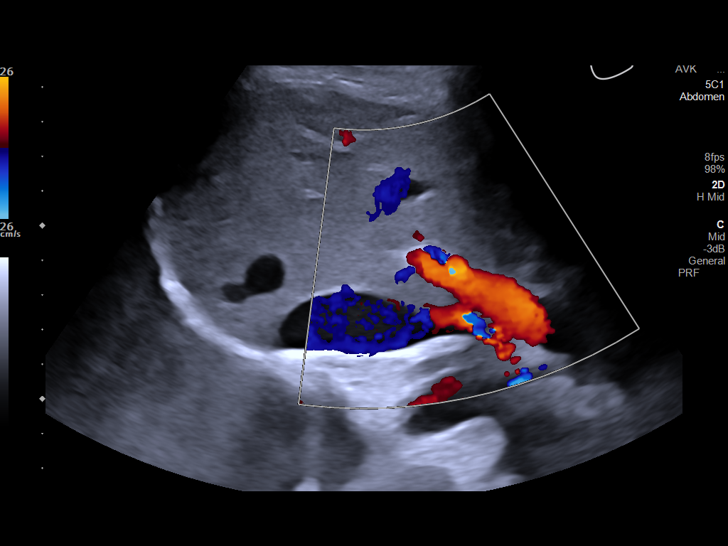
[im 62/136]
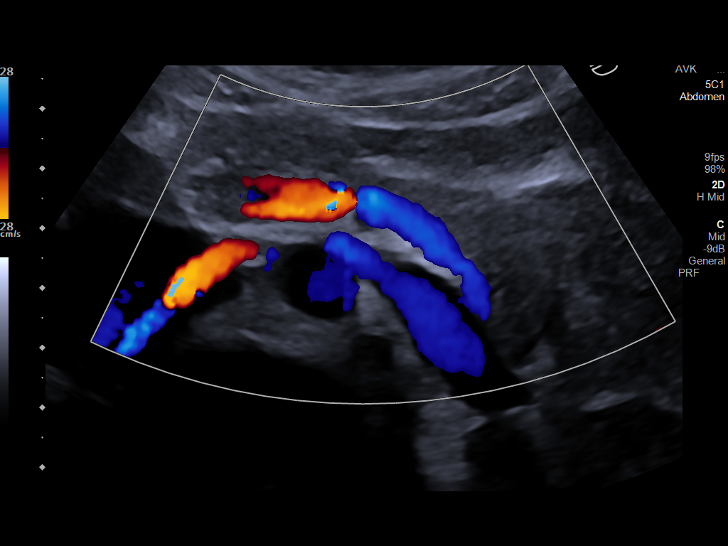
[im 74/136]
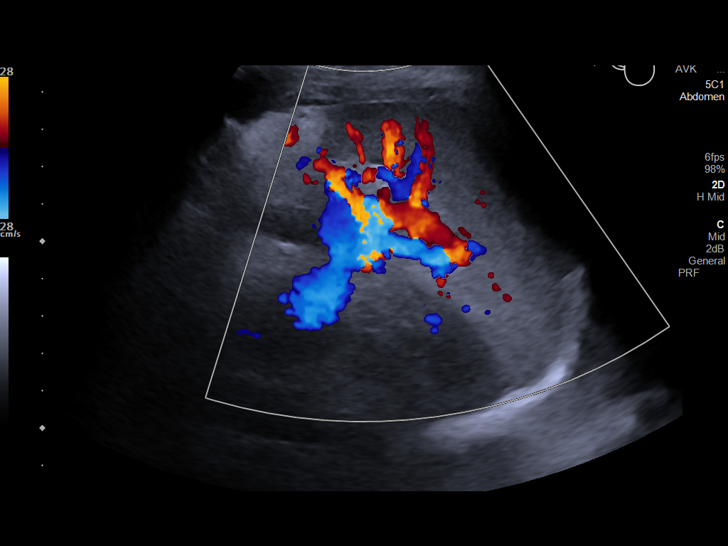
[im 85/136]
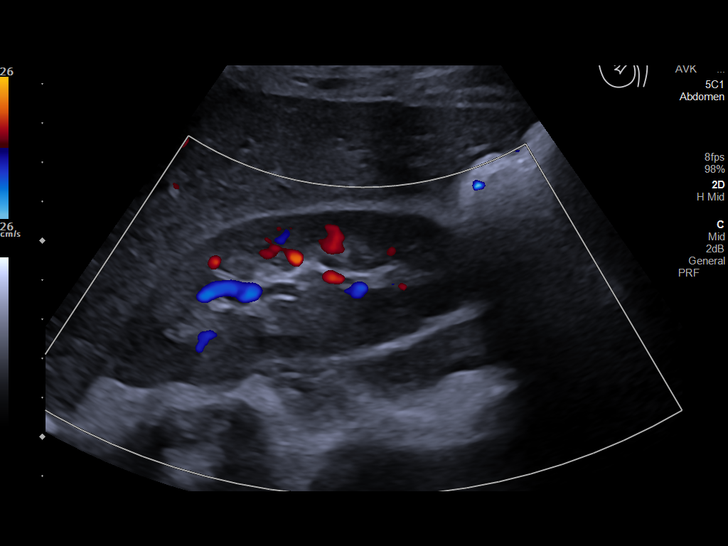
[im 91/136]
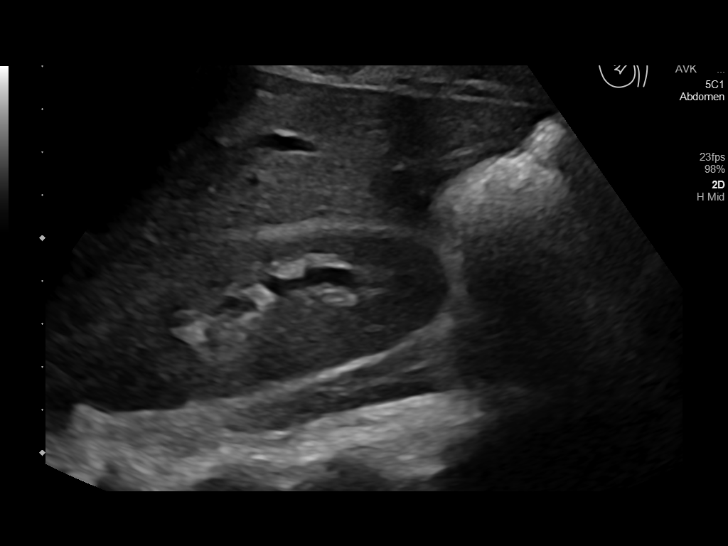
[im 102/136]
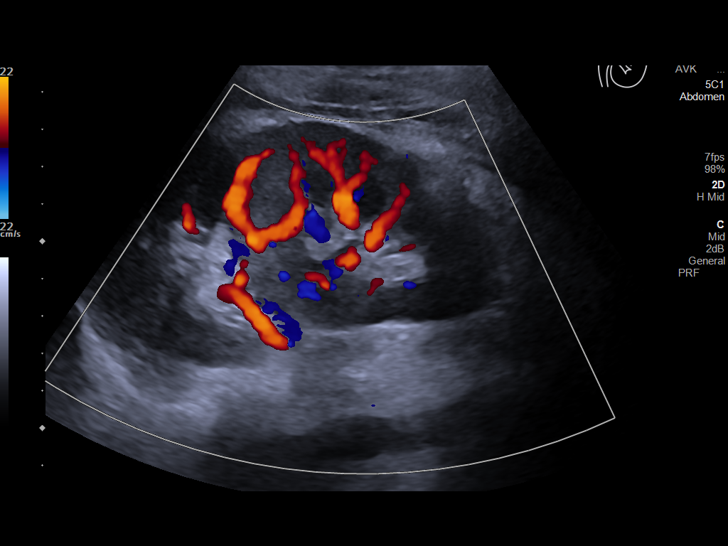
[im 113/136]
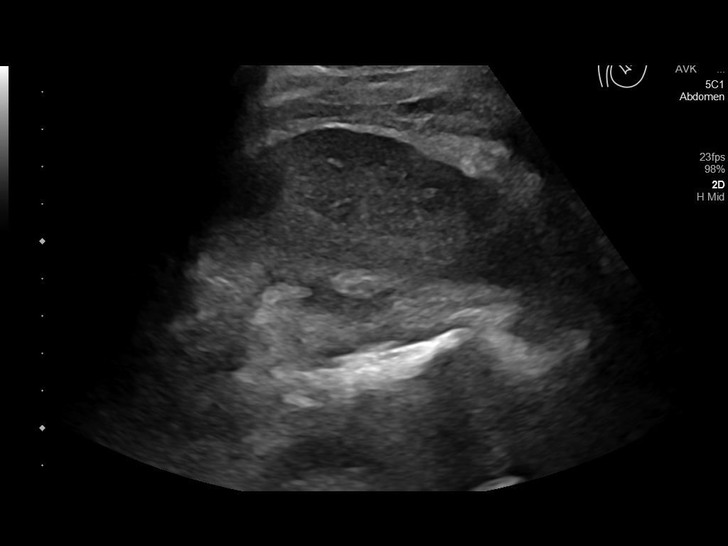
[im 124/136]
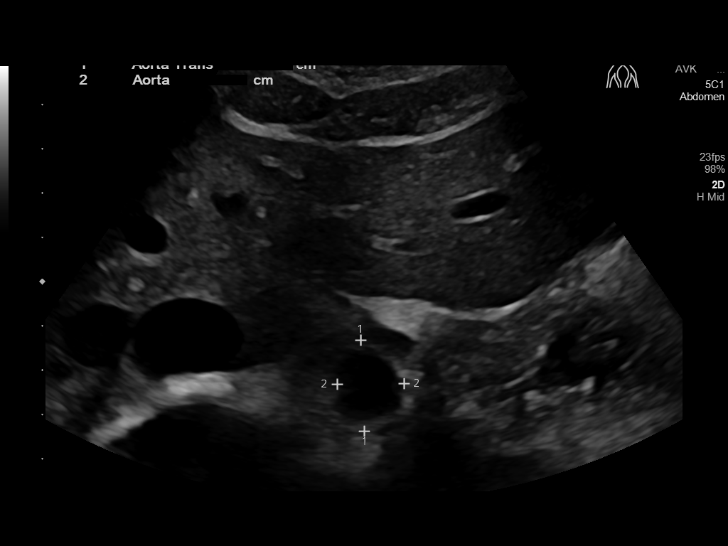
[im 136/136]
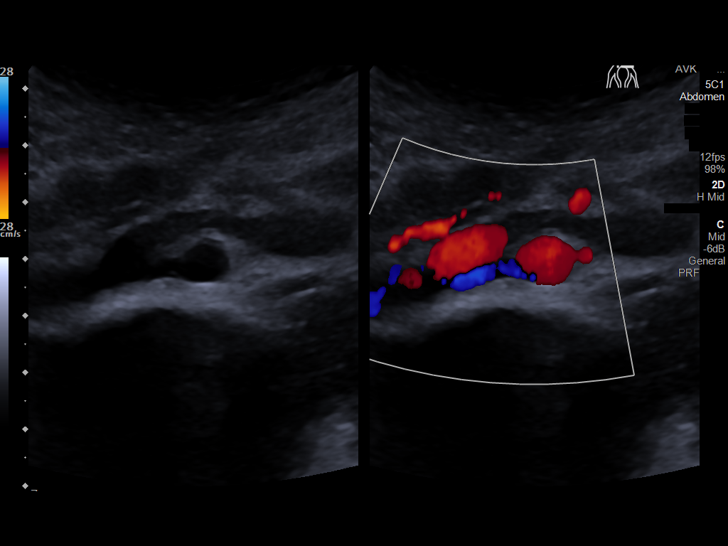

[14 of 25 positions shown; findings below may reference images not displayed]

FINDINGS: Gallbladder: No gallstones or wall thickening visualized. No
sonographic Murphy sign noted by sonographer.

Common bile duct: Diameter: 2 mm

Liver: No focal lesion identified. Within normal limits in
parenchymal echogenicity. Portal vein is patent on color Doppler
imaging with normal direction of blood flow towards the liver.

IVC: No abnormality visualized.

Pancreas: Visualized portion unremarkable.

Spleen: Size and appearance within normal limits.

Right Kidney: Length: 10.3 cm. Echogenicity within normal limits. No
mass or hydronephrosis visualized.

Left Kidney: Length: 11.2 cm. Echogenicity within normal limits. No
mass or hydronephrosis visualized.

Abdominal aorta: No aneurysm visualized.

Other findings: None.
IMPRESSION: Normal abdominal ultrasound.
